# Patient Record
Sex: Female | Born: 1980 | Race: Black or African American | Hispanic: No | Marital: Married | Smoking: Former smoker
Health system: Southern US, Community
[De-identification: ages and names within clinical notes are randomized; demographics above are authoritative.]

## PROBLEM LIST (undated history)

## (undated) ENCOUNTER — Ambulatory Visit: Admission: RE | Payer: PRIVATE HEALTH INSURANCE | Source: Ambulatory Visit | Admitting: Plastic Surgery

## (undated) ENCOUNTER — Inpatient Hospital Stay (HOSPITAL_COMMUNITY): Payer: Self-pay

## (undated) DIAGNOSIS — Z8249 Family history of ischemic heart disease and other diseases of the circulatory system: Secondary | ICD-10-CM

## (undated) DIAGNOSIS — R0683 Snoring: Secondary | ICD-10-CM

## (undated) DIAGNOSIS — F419 Anxiety disorder, unspecified: Secondary | ICD-10-CM

## (undated) DIAGNOSIS — F32A Depression, unspecified: Secondary | ICD-10-CM

## (undated) DIAGNOSIS — Z289 Immunization not carried out for unspecified reason: Secondary | ICD-10-CM

## (undated) DIAGNOSIS — D259 Leiomyoma of uterus, unspecified: Secondary | ICD-10-CM

## (undated) DIAGNOSIS — Z5189 Encounter for other specified aftercare: Secondary | ICD-10-CM

## (undated) DIAGNOSIS — R102 Pelvic and perineal pain: Secondary | ICD-10-CM

## (undated) DIAGNOSIS — O4400 Placenta previa specified as without hemorrhage, unspecified trimester: Secondary | ICD-10-CM

## (undated) DIAGNOSIS — A499 Bacterial infection, unspecified: Secondary | ICD-10-CM

## (undated) DIAGNOSIS — N760 Acute vaginitis: Secondary | ICD-10-CM

## (undated) DIAGNOSIS — T4145XA Adverse effect of unspecified anesthetic, initial encounter: Secondary | ICD-10-CM

## (undated) DIAGNOSIS — O09219 Supervision of pregnancy with history of pre-term labor, unspecified trimester: Secondary | ICD-10-CM

## (undated) DIAGNOSIS — B373 Candidiasis of vulva and vagina: Secondary | ICD-10-CM

## (undated) DIAGNOSIS — Z8632 Personal history of gestational diabetes: Secondary | ICD-10-CM

## (undated) DIAGNOSIS — D649 Anemia, unspecified: Secondary | ICD-10-CM

## (undated) DIAGNOSIS — B9689 Other specified bacterial agents as the cause of diseases classified elsewhere: Secondary | ICD-10-CM

## (undated) DIAGNOSIS — Z8619 Personal history of other infectious and parasitic diseases: Secondary | ICD-10-CM

## (undated) DIAGNOSIS — D539 Nutritional anemia, unspecified: Secondary | ICD-10-CM

## (undated) DIAGNOSIS — O139 Gestational [pregnancy-induced] hypertension without significant proteinuria, unspecified trimester: Secondary | ICD-10-CM

## (undated) DIAGNOSIS — A749 Chlamydial infection, unspecified: Secondary | ICD-10-CM

## (undated) DIAGNOSIS — Z3490 Encounter for supervision of normal pregnancy, unspecified, unspecified trimester: Secondary | ICD-10-CM

## (undated) DIAGNOSIS — F329 Major depressive disorder, single episode, unspecified: Secondary | ICD-10-CM

## (undated) DIAGNOSIS — O09299 Supervision of pregnancy with other poor reproductive or obstetric history, unspecified trimester: Secondary | ICD-10-CM

## (undated) DIAGNOSIS — O24419 Gestational diabetes mellitus in pregnancy, unspecified control: Secondary | ICD-10-CM

## (undated) DIAGNOSIS — Z8659 Personal history of other mental and behavioral disorders: Secondary | ICD-10-CM

## (undated) DIAGNOSIS — B379 Candidiasis, unspecified: Secondary | ICD-10-CM

## (undated) DIAGNOSIS — O9989 Other specified diseases and conditions complicating pregnancy, childbirth and the puerperium: Secondary | ICD-10-CM

## (undated) HISTORY — DX: Depression, unspecified: F32.A

## (undated) HISTORY — DX: Family history of ischemic heart disease and other diseases of the circulatory system: Z82.49

## (undated) HISTORY — DX: Immunization not carried out for unspecified reason: Z28.9

## (undated) HISTORY — DX: Anxiety disorder, unspecified: F41.9

## (undated) HISTORY — DX: Snoring: R06.83

## (undated) HISTORY — DX: Other specified bacterial agents as the cause of diseases classified elsewhere: B96.89

## (undated) HISTORY — DX: Other specified diseases and conditions complicating pregnancy, childbirth and the puerperium: O99.89

## (undated) HISTORY — DX: Encounter for other specified aftercare: Z51.89

## (undated) HISTORY — DX: Nutritional anemia, unspecified: D53.9

## (undated) HISTORY — DX: Personal history of gestational diabetes: Z86.32

## (undated) HISTORY — DX: Adverse effect of unspecified anesthetic, initial encounter: T41.45XA

## (undated) HISTORY — DX: Chlamydial infection, unspecified: A74.9

## (undated) HISTORY — DX: Acute vaginitis: N76.0

## (undated) HISTORY — DX: Leiomyoma of uterus, unspecified: D25.9

## (undated) HISTORY — DX: Personal history of other infectious and parasitic diseases: Z86.19

## (undated) HISTORY — DX: Complete placenta previa nos or without hemorrhage, unspecified trimester: O44.00

## (undated) HISTORY — DX: Supervision of pregnancy with other poor reproductive or obstetric history, unspecified trimester: O09.299

## (undated) HISTORY — DX: Gestational diabetes mellitus in pregnancy, unspecified control: O24.419

## (undated) HISTORY — DX: Personal history of other mental and behavioral disorders: Z86.59

## (undated) HISTORY — DX: Supervision of pregnancy with history of pre-term labor, unspecified trimester: O09.219

## (undated) HISTORY — DX: Major depressive disorder, single episode, unspecified: F32.9

## (undated) HISTORY — DX: Bacterial infection, unspecified: A49.9

## (undated) HISTORY — DX: Candidiasis, unspecified: B37.9

## (undated) HISTORY — DX: Pelvic and perineal pain: R10.2

## (undated) HISTORY — PX: WISDOM TOOTH EXTRACTION: SHX21

## (undated) HISTORY — DX: Candidiasis of vulva and vagina: B37.3

## (undated) HISTORY — DX: Gestational (pregnancy-induced) hypertension without significant proteinuria, unspecified trimester: O13.9

---

## 2002-11-01 ENCOUNTER — Inpatient Hospital Stay (HOSPITAL_COMMUNITY): Admission: AD | Admit: 2002-11-01 | Discharge: 2002-11-01 | Payer: Self-pay | Admitting: *Deleted

## 2002-11-01 ENCOUNTER — Encounter: Payer: Self-pay | Admitting: *Deleted

## 2002-11-11 ENCOUNTER — Other Ambulatory Visit: Admission: RE | Admit: 2002-11-11 | Discharge: 2002-11-11 | Payer: Self-pay | Admitting: Obstetrics and Gynecology

## 2002-11-28 ENCOUNTER — Inpatient Hospital Stay (HOSPITAL_COMMUNITY): Admission: AD | Admit: 2002-11-28 | Discharge: 2002-11-28 | Payer: Self-pay | Admitting: Obstetrics and Gynecology

## 2003-02-05 ENCOUNTER — Inpatient Hospital Stay (HOSPITAL_COMMUNITY): Admission: AD | Admit: 2003-02-05 | Discharge: 2003-02-05 | Payer: Self-pay | Admitting: Obstetrics and Gynecology

## 2003-02-05 ENCOUNTER — Encounter: Payer: Self-pay | Admitting: Obstetrics and Gynecology

## 2003-02-24 ENCOUNTER — Encounter: Payer: Self-pay | Admitting: Obstetrics and Gynecology

## 2003-02-24 ENCOUNTER — Ambulatory Visit (HOSPITAL_COMMUNITY): Admission: RE | Admit: 2003-02-24 | Discharge: 2003-02-24 | Payer: Self-pay | Admitting: Obstetrics and Gynecology

## 2003-03-18 ENCOUNTER — Inpatient Hospital Stay (HOSPITAL_COMMUNITY): Admission: AD | Admit: 2003-03-18 | Discharge: 2003-03-18 | Payer: Self-pay | Admitting: Obstetrics and Gynecology

## 2003-05-21 ENCOUNTER — Inpatient Hospital Stay (HOSPITAL_COMMUNITY): Admission: AD | Admit: 2003-05-21 | Discharge: 2003-05-22 | Payer: Self-pay | Admitting: Obstetrics and Gynecology

## 2003-09-06 DIAGNOSIS — N760 Acute vaginitis: Secondary | ICD-10-CM

## 2003-09-06 DIAGNOSIS — R102 Pelvic and perineal pain unspecified side: Secondary | ICD-10-CM

## 2003-09-06 DIAGNOSIS — B9689 Other specified bacterial agents as the cause of diseases classified elsewhere: Secondary | ICD-10-CM

## 2003-09-06 HISTORY — DX: Other specified bacterial agents as the cause of diseases classified elsewhere: B96.89

## 2003-09-06 HISTORY — DX: Other specified bacterial agents as the cause of diseases classified elsewhere: N76.0

## 2003-09-06 HISTORY — PX: DILATION AND CURETTAGE OF UTERUS: SHX78

## 2003-09-06 HISTORY — DX: Pelvic and perineal pain unspecified side: R10.20

## 2003-09-06 HISTORY — DX: Pelvic and perineal pain: R10.2

## 2004-03-28 ENCOUNTER — Emergency Department (HOSPITAL_COMMUNITY): Admission: EM | Admit: 2004-03-28 | Discharge: 2004-03-28 | Payer: Self-pay | Admitting: Emergency Medicine

## 2004-07-23 ENCOUNTER — Inpatient Hospital Stay (HOSPITAL_COMMUNITY): Admission: AD | Admit: 2004-07-23 | Discharge: 2004-07-23 | Payer: Self-pay | Admitting: Obstetrics and Gynecology

## 2004-09-29 ENCOUNTER — Emergency Department (HOSPITAL_COMMUNITY): Admission: EM | Admit: 2004-09-29 | Discharge: 2004-09-29 | Payer: Self-pay | Admitting: Emergency Medicine

## 2005-01-31 ENCOUNTER — Emergency Department (HOSPITAL_COMMUNITY): Admission: EM | Admit: 2005-01-31 | Discharge: 2005-02-01 | Payer: Self-pay | Admitting: Emergency Medicine

## 2005-04-14 ENCOUNTER — Ambulatory Visit: Payer: Self-pay | Admitting: *Deleted

## 2005-04-14 ENCOUNTER — Inpatient Hospital Stay (HOSPITAL_COMMUNITY): Admission: AD | Admit: 2005-04-14 | Discharge: 2005-04-14 | Payer: Self-pay | Admitting: *Deleted

## 2005-04-14 ENCOUNTER — Ambulatory Visit (HOSPITAL_COMMUNITY): Admission: RE | Admit: 2005-04-14 | Discharge: 2005-04-14 | Payer: Self-pay | Admitting: Family Medicine

## 2005-04-16 ENCOUNTER — Inpatient Hospital Stay (HOSPITAL_COMMUNITY): Admission: AD | Admit: 2005-04-16 | Discharge: 2005-04-16 | Payer: Self-pay | Admitting: Obstetrics and Gynecology

## 2005-05-06 ENCOUNTER — Ambulatory Visit (HOSPITAL_COMMUNITY): Admission: RE | Admit: 2005-05-06 | Discharge: 2005-05-06 | Payer: Self-pay | Admitting: Obstetrics & Gynecology

## 2005-06-27 ENCOUNTER — Ambulatory Visit (HOSPITAL_COMMUNITY): Admission: RE | Admit: 2005-06-27 | Discharge: 2005-06-27 | Payer: Self-pay | Admitting: Obstetrics

## 2005-07-24 ENCOUNTER — Inpatient Hospital Stay (HOSPITAL_COMMUNITY): Admission: AD | Admit: 2005-07-24 | Discharge: 2005-07-24 | Payer: Self-pay | Admitting: Obstetrics & Gynecology

## 2005-09-05 DIAGNOSIS — Z5189 Encounter for other specified aftercare: Secondary | ICD-10-CM

## 2005-09-05 DIAGNOSIS — O24419 Gestational diabetes mellitus in pregnancy, unspecified control: Secondary | ICD-10-CM

## 2005-09-05 DIAGNOSIS — O139 Gestational [pregnancy-induced] hypertension without significant proteinuria, unspecified trimester: Secondary | ICD-10-CM

## 2005-09-05 DIAGNOSIS — IMO0001 Reserved for inherently not codable concepts without codable children: Secondary | ICD-10-CM

## 2005-09-05 DIAGNOSIS — T8859XA Other complications of anesthesia, initial encounter: Secondary | ICD-10-CM

## 2005-09-05 HISTORY — DX: Reserved for inherently not codable concepts without codable children: IMO0001

## 2005-09-05 HISTORY — DX: Other complications of anesthesia, initial encounter: T88.59XA

## 2005-09-05 HISTORY — DX: Gestational diabetes mellitus in pregnancy, unspecified control: O24.419

## 2005-09-05 HISTORY — DX: Gestational (pregnancy-induced) hypertension without significant proteinuria, unspecified trimester: O13.9

## 2005-09-05 HISTORY — DX: Encounter for other specified aftercare: Z51.89

## 2006-01-01 ENCOUNTER — Inpatient Hospital Stay (HOSPITAL_COMMUNITY): Admission: AD | Admit: 2006-01-01 | Discharge: 2006-01-01 | Payer: Self-pay | Admitting: Obstetrics & Gynecology

## 2006-11-18 ENCOUNTER — Emergency Department (HOSPITAL_COMMUNITY): Admission: EM | Admit: 2006-11-18 | Discharge: 2006-11-18 | Payer: Self-pay | Admitting: *Deleted

## 2008-09-05 HISTORY — PX: LAPAROSCOPIC GASTRIC BANDING: SHX1100

## 2009-09-05 DIAGNOSIS — B373 Candidiasis of vulva and vagina: Secondary | ICD-10-CM

## 2009-09-05 DIAGNOSIS — B3731 Acute candidiasis of vulva and vagina: Secondary | ICD-10-CM

## 2009-09-05 HISTORY — DX: Acute candidiasis of vulva and vagina: B37.31

## 2009-09-05 HISTORY — DX: Candidiasis of vulva and vagina: B37.3

## 2009-10-20 ENCOUNTER — Emergency Department (HOSPITAL_COMMUNITY): Admission: EM | Admit: 2009-10-20 | Discharge: 2009-10-20 | Payer: Self-pay | Admitting: Emergency Medicine

## 2010-02-02 ENCOUNTER — Emergency Department (HOSPITAL_COMMUNITY): Admission: EM | Admit: 2010-02-02 | Discharge: 2010-02-02 | Payer: Self-pay | Admitting: Emergency Medicine

## 2010-02-18 ENCOUNTER — Inpatient Hospital Stay (HOSPITAL_COMMUNITY): Admission: AD | Admit: 2010-02-18 | Discharge: 2010-02-19 | Payer: Self-pay | Admitting: Obstetrics & Gynecology

## 2010-03-13 ENCOUNTER — Emergency Department (HOSPITAL_COMMUNITY): Admission: EM | Admit: 2010-03-13 | Discharge: 2010-03-13 | Payer: Self-pay | Admitting: Emergency Medicine

## 2010-07-26 ENCOUNTER — Ambulatory Visit: Payer: Self-pay | Admitting: Internal Medicine

## 2010-08-11 ENCOUNTER — Encounter: Admission: RE | Admit: 2010-08-11 | Payer: Self-pay | Source: Home / Self Care | Admitting: Obstetrics and Gynecology

## 2010-08-18 ENCOUNTER — Encounter: Admission: RE | Admit: 2010-08-18 | Payer: Self-pay | Source: Home / Self Care | Admitting: Obstetrics and Gynecology

## 2010-09-05 DIAGNOSIS — F32A Depression, unspecified: Secondary | ICD-10-CM

## 2010-09-05 HISTORY — DX: Depression, unspecified: F32.A

## 2010-09-11 ENCOUNTER — Inpatient Hospital Stay (HOSPITAL_COMMUNITY)
Admission: AD | Admit: 2010-09-11 | Discharge: 2010-09-11 | Payer: Self-pay | Source: Home / Self Care | Attending: Obstetrics and Gynecology | Admitting: Obstetrics and Gynecology

## 2010-09-20 LAB — URINALYSIS, ROUTINE W REFLEX MICROSCOPIC
Bilirubin Urine: NEGATIVE
Hgb urine dipstick: NEGATIVE
Ketones, ur: NEGATIVE mg/dL
Nitrite: NEGATIVE
Protein, ur: NEGATIVE mg/dL
Specific Gravity, Urine: 1.01 (ref 1.005–1.030)
Urine Glucose, Fasting: NEGATIVE mg/dL
Urobilinogen, UA: 0.2 mg/dL (ref 0.0–1.0)
pH: 6.5 (ref 5.0–8.0)

## 2010-09-20 LAB — COMPREHENSIVE METABOLIC PANEL
ALT: 9 U/L (ref 0–35)
AST: 13 U/L (ref 0–37)
Albumin: 2.4 g/dL — ABNORMAL LOW (ref 3.5–5.2)
Alkaline Phosphatase: 123 U/L — ABNORMAL HIGH (ref 39–117)
BUN: 3 mg/dL — ABNORMAL LOW (ref 6–23)
CO2: 24 mEq/L (ref 19–32)
Calcium: 8.8 mg/dL (ref 8.4–10.5)
Chloride: 106 mEq/L (ref 96–112)
Creatinine, Ser: 0.53 mg/dL (ref 0.4–1.2)
GFR calc Af Amer: >60 mL/min (ref >60)
GFR calc non Af Amer: >60 mL/min (ref >60)
Glucose, Bld: 126 mg/dL — ABNORMAL HIGH (ref 70–99)
Potassium: 3.3 mEq/L — ABNORMAL LOW (ref 3.5–5.1)
Sodium: 135 mEq/L (ref 135–145)
Total Bilirubin: 0.2 mg/dL — ABNORMAL LOW (ref 0.3–1.2)
Total Protein: 6 g/dL (ref 6.0–8.3)

## 2010-09-20 LAB — URINE CULTURE
Colony Count: 50000
Culture  Setup Time: 201201072033
Special Requests: NEGATIVE

## 2010-09-20 LAB — CBC
HCT: 25 % — ABNORMAL LOW (ref 36.0–46.0)
Hemoglobin: 7.1 g/dL — ABNORMAL LOW (ref 12.0–15.0)
MCH: 16.3 pg — ABNORMAL LOW (ref 26.0–34.0)
MCHC: 28.4 g/dL — ABNORMAL LOW (ref 30.0–36.0)
MCV: 57.3 fL — ABNORMAL LOW (ref 78.0–100.0)
Platelets: 304 10*3/uL (ref 150–400)
RBC: 4.36 MIL/uL (ref 3.87–5.11)
RDW: 20.7 % — ABNORMAL HIGH (ref 11.5–15.5)
WBC: 10.1 10*3/uL (ref 4.0–10.5)

## 2010-09-20 LAB — URINE MICROSCOPIC-ADD ON

## 2010-09-20 LAB — URIC ACID: Uric Acid, Serum: 3.3 mg/dL (ref 2.4–7.0)

## 2010-09-20 LAB — LACTATE DEHYDROGENASE: LDH: 101 U/L (ref 94–250)

## 2010-09-22 LAB — BASIC METABOLIC PANEL
BUN: 4 mg/dL — ABNORMAL LOW (ref 6–23)
CO2: 24 mEq/L (ref 19–32)
Calcium: 8.6 mg/dL (ref 8.4–10.5)
Chloride: 106 mEq/L (ref 96–112)
Creatinine, Ser: 0.55 mg/dL (ref 0.4–1.2)
GFR calc Af Amer: >60 mL/min (ref >60)
GFR calc non Af Amer: >60 mL/min (ref >60)
Glucose, Bld: 112 mg/dL — ABNORMAL HIGH (ref 70–99)
Potassium: 3.8 mEq/L (ref 3.5–5.1)
Sodium: 136 mEq/L (ref 135–145)

## 2010-09-22 LAB — CBC
HCT: 26.3 % — ABNORMAL LOW (ref 36.0–46.0)
Hemoglobin: 7.2 g/dL — ABNORMAL LOW (ref 12.0–15.0)
MCH: 15.8 pg — ABNORMAL LOW (ref 26.0–34.0)
MCHC: 27.4 g/dL — ABNORMAL LOW (ref 30.0–36.0)
MCV: 57.8 fL — ABNORMAL LOW (ref 78.0–100.0)
Platelets: 374 10*3/uL (ref 150–400)
RBC: 4.55 MIL/uL (ref 3.87–5.11)
RDW: 20.8 % — ABNORMAL HIGH (ref 11.5–15.5)
WBC: 8.8 10*3/uL (ref 4.0–10.5)

## 2010-09-22 LAB — SURGICAL PCR SCREEN
MRSA, PCR: NEGATIVE
Staphylococcus aureus: NEGATIVE

## 2010-09-22 LAB — RPR: RPR Ser Ql: NONREACTIVE

## 2010-09-25 ENCOUNTER — Encounter (INDEPENDENT_AMBULATORY_CARE_PROVIDER_SITE_OTHER): Payer: Self-pay | Admitting: Obstetrics and Gynecology

## 2010-09-25 ENCOUNTER — Inpatient Hospital Stay (HOSPITAL_COMMUNITY)
Admission: AD | Admit: 2010-09-25 | Discharge: 2010-09-27 | Payer: Self-pay | Source: Home / Self Care | Attending: Obstetrics and Gynecology | Admitting: Obstetrics and Gynecology

## 2010-09-26 ENCOUNTER — Encounter: Payer: Self-pay | Admitting: Obstetrics & Gynecology

## 2010-09-27 ENCOUNTER — Inpatient Hospital Stay: Admission: RE | Admit: 2010-09-27 | Payer: Self-pay | Source: Home / Self Care | Admitting: Obstetrics and Gynecology

## 2010-09-28 LAB — RPR: RPR Ser Ql: NONREACTIVE

## 2010-09-28 LAB — CBC
HCT: 27 % — ABNORMAL LOW (ref 36.0–46.0)
HCT: 21.3 % — ABNORMAL LOW (ref 36.0–46.0)
Hemoglobin: 7.5 g/dL — ABNORMAL LOW (ref 12.0–15.0)
Hemoglobin: 5.7 g/dL — CL (ref 12.0–15.0)
MCH: 15.9 pg — ABNORMAL LOW (ref 26.0–34.0)
MCHC: 27.8 g/dL — ABNORMAL LOW (ref 30.0–36.0)
MCHC: 27.2 g/dL — ABNORMAL LOW (ref 30.0–36.0)
MCV: 57.3 fL — ABNORMAL LOW (ref 78.0–100.0)
Platelets: 362 10*3/uL (ref 150–400)
Platelets: 299 10*3/uL (ref 150–400)
RBC: 4.71 MIL/uL (ref 3.87–5.11)
RBC: 4.41 MIL/uL (ref 3.87–5.11)
RDW: 20.7 % — ABNORMAL HIGH (ref 11.5–15.5)
RDW: 20.8 % — ABNORMAL HIGH (ref 11.5–15.5)
RDW: 23.2 % — ABNORMAL HIGH (ref 11.5–15.5)
WBC: 11.9 10*3/uL — ABNORMAL HIGH (ref 4.0–10.5)
WBC: 10.1 10*3/uL (ref 4.0–10.5)
WBC: 11.3 10*3/uL — ABNORMAL HIGH (ref 4.0–10.5)

## 2010-09-28 LAB — GLUCOSE, CAPILLARY
Glucose-Capillary: 132 mg/dL — ABNORMAL HIGH (ref 70–99)
Glucose-Capillary: 142 mg/dL — ABNORMAL HIGH (ref 70–99)
Glucose-Capillary: 105 mg/dL — ABNORMAL HIGH (ref 70–99)
Glucose-Capillary: 136 mg/dL — ABNORMAL HIGH (ref 70–99)
Glucose-Capillary: 66 mg/dL — ABNORMAL LOW (ref 70–99)

## 2010-09-28 LAB — TYPE AND SCREEN
ABO/RH(D): A POS
Antibody Screen: NEGATIVE
Unit division: 0
Unit division: 0

## 2010-09-28 LAB — RETICULOCYTES
RBC.: 3.6 MIL/uL — ABNORMAL LOW (ref 3.87–5.11)
Retic Count, Absolute: 72 10*3/uL (ref 19.0–186.0)
Retic Ct Pct: 2 % (ref 0.4–3.1)

## 2010-09-28 LAB — VITAMIN B12: Vitamin B-12: 262 pg/mL (ref 211–911)

## 2010-09-28 LAB — FOLATE: Folate: 6.5 ng/mL

## 2010-09-28 LAB — IRON AND TIBC: TIBC: 337 ug/dL (ref 250–470)

## 2010-09-30 NOTE — Op Note (Addendum)
NAME:  [name], [name]               ACCOUNT NO.:  [id number]  MEDICAL RECORD NO.:  [id number]          PATIENT TYPE:  INP  LOCATION:  9136                          FACILITY:  [facility]  PHYSICIAN:  Hal Morales, M.D.DATE OF BIRTH:  [birth year]  DATE OF PROCEDURE:  09/25/2010 DATE OF DISCHARGE:                              OPERATIVE REPORT   PREOPERATIVE DIAGNOSES: 1. Intrauterine pregnancy at 38 weeks and 5 days. 2. Prior cesarean section with desire for repeat cesarean section. 3. Gestational diabetes. 4. Question type 2 diabetes versus gestational diabetes 5. Severe anemia. 6. Morbid obesity. 7. History of macrosomia.  POSTOPERATIVE DIAGNOSES: 1. Intrauterine pregnancy at 38 weeks and 5 days. 2. Prior cesarean section with desire for repeat cesarean section. 3. Gestational diabetes. 4. Question type 2 diabetes versus gestational diabetes 5. Severe anemia. 6. Morbid obesity. 7. History of macrosomia. 8. Meconium-stained amniotic fluid.  PROCEDURE:  Repeat low transverse cesarean section.  SURGEON:  Hal Morales, MD  FIRST ASSISTANT:  Larna Daughters, CNM  ANESTHESIA:  Spinal.  ESTIMATED BLOOD LOSS:  750 mL.  COMPLICATIONS:  None.  FINDINGS:  The patient was delivered of a female infant, weighing 8 pounds 2 ounces with Apgars of 9 and 9 at 1 and 5 minutes respectively.  The placenta contained an eccentrically inserted 3-vessel cord.  The membranes were meconium-stained.  The uterus, tubes, and ovaries were normal for the gravid state.  After removal of the placenta, there were some remaining meconium-stained membranes which required curettage.  DESCRIPTION OF PROCEDURE:  The patient was taken to the operating room after appropriate identification and after the establishment that early labor had ensued.  Membranes were intact.  She was taken to the operating room and a spinal anesthetic was placed.  The abdomen was prepped with ChloraPrep and the perineum  was prepped with Betadine.  The Foley catheter was inserted into the bladder under sterile conditions and connected to straight drainage.  The abdomen was draped as a sterile field after a 3-minute drying period for the ChloraPrep.  The site of the previous cesarean section was tested and found to have adequate surgical anesthesia.  This area was infiltrated with 10 mL of 0.25% Marcaine.  An incision was then placed at the site of the previous cesarean section incision and the abdomen was opened in layers.  The peritoneum was entered and the bladder blade placed.  The uterus was incised approximately 2 cm above the uterovesical fold and that incision taken laterally on either side bluntly.  The infant was delivered from the occiput transverse position and after having cord clamped and cut, was handed off to the awaiting pediatricians.  The placenta was allowed to separate from the uterus with uterine massage, however, there was a fair amount of membranes that were left in the uterus and this was removed with sharp curettage and laparotomy pads.  Uterine incision was then closed with a running interlocking suture of 0 Vicryl.  An imbricating suture of 0 Vicryl was placed.  Adequate hemostasis was achieved after placement of a serosal suture of 0 Vicryl.  Copious irrigation was carried out.  The abdominal peritoneum was closed with running suture of 2-0 Vicryl.  The rectus muscles were reapproximated in the midline with figure-of-eight suture of 2-0 Vicryl.  The rectus fascia was closed with running suture of 0 Vicryl, then reinforced on either side of midline with figure-of-eight sutures of 0 Vicryl.  The subcutaneous tissue was copiously irrigated and made hemostatic with Bovie cautery.  A 7-mm Jackson-Pratt drain was placed in the subcutaneous tissue and exited through a stab wound in the left lower quadrant.  It was sewn in place with a suture of 0 silk.  The skin incision was closed  with a subcuticular suture of 3-0 Monocryl.  The grenade was attached to the Jackson-Pratt drain and a sterile dressing applied.  The patient was taken from the operating room to the recovery room in satisfactory condition, having tolerated the procedure well with sponge and instrument counts correct.  Specimens to pathology, placenta and endometrial curettings.  The infant went to the full-term nursery.     Hal Morales, M.D.     VPH/MEDQ  D:  09/25/2010  T:  09/25/2010  Job:  [id number]  Electronically Signed by Dierdre Forth M.D. on 09/30/2010 03:33:02 PM

## 2010-11-21 LAB — URINALYSIS, ROUTINE W REFLEX MICROSCOPIC
Bilirubin Urine: NEGATIVE
Hgb urine dipstick: NEGATIVE
Ketones, ur: NEGATIVE mg/dL
Nitrite: NEGATIVE
Protein, ur: NEGATIVE mg/dL
Specific Gravity, Urine: 1.02 (ref 1.005–1.030)
Urobilinogen, UA: 0.2 mg/dL (ref 0.0–1.0)

## 2010-11-21 LAB — CBC
MCHC: 30.7 g/dL (ref 30.0–36.0)
MCV: 59.1 fL — ABNORMAL LOW (ref 78.0–100.0)
RBC: 4.84 MIL/uL (ref 3.87–5.11)
RDW: 19.3 % — ABNORMAL HIGH (ref 11.5–15.5)

## 2010-11-21 LAB — HCG, QUANTITATIVE, PREGNANCY: hCG, Beta Chain, Quant, S: 74498 m[IU]/mL — ABNORMAL HIGH (ref <5)

## 2010-11-22 LAB — URINALYSIS, ROUTINE W REFLEX MICROSCOPIC
Bilirubin Urine: NEGATIVE
Hgb urine dipstick: NEGATIVE
Ketones, ur: NEGATIVE mg/dL
Specific Gravity, Urine: 1.012 (ref 1.005–1.030)
Urobilinogen, UA: 0.2 mg/dL (ref 0.0–1.0)

## 2010-11-22 LAB — URINE MICROSCOPIC-ADD ON

## 2010-11-22 LAB — POCT PREGNANCY, URINE: Preg Test, Ur: POSITIVE

## 2012-02-05 ENCOUNTER — Inpatient Hospital Stay (HOSPITAL_COMMUNITY)
Admission: AD | Admit: 2012-02-05 | Discharge: 2012-02-05 | Disposition: A | Discharge status: Home / Self Care | Payer: Medicaid Other | Source: Ambulatory Visit | Attending: Family Medicine | Admitting: Family Medicine

## 2012-02-05 ENCOUNTER — Encounter (HOSPITAL_COMMUNITY): Payer: Self-pay

## 2012-02-05 ENCOUNTER — Inpatient Hospital Stay (HOSPITAL_COMMUNITY): Payer: Medicaid Other

## 2012-02-05 DIAGNOSIS — E669 Obesity, unspecified: Secondary | ICD-10-CM | POA: Insufficient documentation

## 2012-02-05 DIAGNOSIS — O209 Hemorrhage in early pregnancy, unspecified: Secondary | ICD-10-CM | POA: Insufficient documentation

## 2012-02-05 DIAGNOSIS — B373 Candidiasis of vulva and vagina: Secondary | ICD-10-CM | POA: Insufficient documentation

## 2012-02-05 DIAGNOSIS — D509 Iron deficiency anemia, unspecified: Secondary | ICD-10-CM | POA: Insufficient documentation

## 2012-02-05 DIAGNOSIS — O2390 Unspecified genitourinary tract infection in pregnancy, unspecified trimester: Secondary | ICD-10-CM | POA: Insufficient documentation

## 2012-02-05 DIAGNOSIS — O99019 Anemia complicating pregnancy, unspecified trimester: Secondary | ICD-10-CM | POA: Insufficient documentation

## 2012-02-05 DIAGNOSIS — B3731 Acute candidiasis of vulva and vagina: Secondary | ICD-10-CM | POA: Insufficient documentation

## 2012-02-05 HISTORY — DX: Anemia, unspecified: D64.9

## 2012-02-05 LAB — CBC
Hemoglobin: 6.7 g/dL — CL (ref 12.0–15.0)
MCH: 14.8 pg — ABNORMAL LOW (ref 26.0–34.0)
MCV: 54.6 fL — ABNORMAL LOW (ref 78.0–100.0)
RBC: 4.54 MIL/uL (ref 3.87–5.11)

## 2012-02-05 LAB — URINALYSIS, ROUTINE W REFLEX MICROSCOPIC
Ketones, ur: NEGATIVE mg/dL
Nitrite: NEGATIVE
Protein, ur: NEGATIVE mg/dL
pH: 6 (ref 5.0–8.0)

## 2012-02-05 LAB — WET PREP, GENITAL
Clue Cells Wet Prep HPF POC: NONE SEEN
Trich, Wet Prep: NONE SEEN

## 2012-02-05 MED ORDER — CONCEPT OB 130-92.4-1 MG PO CAPS: 1.0000 | ORAL_CAPSULE | ORAL | Status: DC

## 2012-02-05 MED ORDER — FLUCONAZOLE 150 MG PO TABS: 150.0000 mg | ORAL_TABLET | Freq: Once | ORAL | Status: AC

## 2012-02-05 NOTE — MAU Note (Signed)
CRITICAL VALUE ALERT  Critical value received:  Hemoglobin 6.7  Date of notification:  02/05/12  Time of notification:  0535  Critical value read back:yes  Nurse who received alert:  Quintella Baton [facility]  MD notified (1st page):  Deirdre Poe CNM  Time of first page:  [phone]  MD notified (2nd page):  Time of second page:  Responding MD:    Time MD responded:

## 2012-02-05 NOTE — Discharge Instructions (Signed)
Candidal Vulvovaginitis Candidal vulvovaginitis is an infection of the vagina and vulva. The vulva is the skin around the opening of the vagina. This may cause itching and discomfort in and around the vagina.  HOME CARE  Only take medicine as told by your doctor.   Do not have sex (intercourse) until the infection is healed or as told by your doctor.   Practice safe sex.   Tell your sex partner about your infection.   Do not douche or use tampons.   Wear cotton underwear. Do not wear tight pants or panty hose.   Eat yogurt. This may help treat and prevent yeast infections.  GET HELP RIGHT AWAY IF:   You have a fever.   Your problems get worse during treatment or do not get better in 3 days.   You have discomfort, irritation, or itching in your vagina or vulva area.   You have pain after sex.   You start to get belly (abdominal) pain.  MAKE SURE YOU:  Understand these instructions.   Will watch your condition.   Will get help right away if you are not doing well or get worse.  Document Released: 11/18/2008 Document Revised: 08/11/2011 Document Reviewed: 11/18/2008 [facility] Patient Information 2012 [place], [place].  Prenatal [facility]    [facility]  & [facility]ertility  [phone]     Phone: [phone]          [facility]                      [facility]  @[place]     Phone: [phone]  Phone: [phone]         [facility]     Phone: [phone]  Phone: [phone]           [facility] @ [place]                hone: [phone]  Phone: [phone]         [facility] Dr. Francoise Ceo      Phone: [phone]  Phone: [phone]         [facility]                Phone: [phone]  [facility]   Phone:[phone]    [facility] [address] [place])          Phone:  [phone] [facility]   Phone: [phone]

## 2012-02-05 NOTE — MAU Provider Note (Signed)
[name] [id number] @[redacted]w[redacted]d  by LMP Chief Complaint  Patient presents with  . Vaginal Bleeding  . Abdominal Cramping        SUBJECTIVE  HPI Scanty pink vaginal spotting x 1 day. No abd pain. Intercourse 2 days ago. Also having vulvovaginal itch and self treated with Monistat last night. A pos. [facility] pt, NPC yet.   Past Medical History  Diagnosis Date  . Anemia    No past surgical history on file. History   Social History  . Marital Status: Married    Spouse Name: N/A    Number of Children: N/A  . Years of Education: N/A   Occupational History  . Not on file.   Social History Main Topics  . Smoking status: Current Everyday Smoker    Types: Cigarettes  . Smokeless tobacco: Not on file   Comment: patient states that she quit nd week of may2013  . Alcohol Use: No  . Drug Use: No  . Sexually Active: Yes    Birth Control/ Protection: None   Other Topics Concern  . Not on file   Social History Narrative  . No narrative on file   No current facility-administered medications on file prior to encounter.   No current outpatient prescriptions on file prior to encounter.   Allergies not on file  ROS: Pertinent items in HPI  OBJECTIVE Blood pressure 133/71, pulse 83, temperature 98.2 F (36.8 C), temperature source Oral, resp. rate 18, last menstrual period 12/20/2011.  GENERAL: Well-developed, well-nourished female in no acute distress.  HEENT: Normocephalic, good dentition HEART: normal rate RESP: normal effort ABDOMEN: Soft, nontender EXTREMITIES: Nontender, no edema NEURO: Alert and oriented SPECULUM EXAM: pale vulvar erythematous rash extends to intertriginous area., thick white discharge, scant brown blood noted, cervix clean BIMANUAL: cx long, closed, unable to outline uterus,  no adnexal tenderness or masses   LAB RESULTS  Results for orders placed during the hospital encounter of 02/05/12 (from the past 24 hour(s))  URINALYSIS, ROUTINE W REFLEX  MICROSCOPIC     Status: Abnormal   Collection Time   02/05/12  4:20 AM      Component Value Range   Color, Urine YELLOW  YELLOW    APPearance HAZY (*) CLEAR    Specific Gravity, Urine <1.005 (*) 1.005 - 1.030    pH 6.0  5.0 - 8.0    Glucose, UA NEGATIVE  NEGATIVE (mg/dL)   Hgb urine dipstick TRACE (*) NEGATIVE    Bilirubin Urine NEGATIVE  NEGATIVE    Ketones, ur NEGATIVE  NEGATIVE (mg/dL)   Protein, ur NEGATIVE  NEGATIVE (mg/dL)   Urobilinogen, UA 0.2  0.0 - 1.0 (mg/dL)   Nitrite NEGATIVE  NEGATIVE    Leukocytes, UA MODERATE (*) NEGATIVE   URINE MICROSCOPIC-ADD ON     Status: Abnormal   Collection Time   02/05/12  4:20 AM      Component Value Range   Squamous Epithelial / LPF FEW (*) RARE    WBC, UA 3-6  <3 (WBC/hpf)   RBC / HPF 0-2  <3 (RBC/hpf)   Bacteria, UA FEW (*) RARE   POCT PREGNANCY, URINE     Status: Abnormal   Collection Time   02/05/12  4:35 AM      Component Value Range   Preg Test, Ur POSITIVE (*) NEGATIVE   CBC     Status: Abnormal   Collection Time   02/05/12  5:10 AM      Component Value Range  WBC 7.7  4.0 - 10.5 (K/uL)   RBC 4.54  3.87 - 5.11 (MIL/uL)   Hemoglobin 6.7 (*) 12.0 - 15.0 (g/dL)   HCT 16.1 (*) 09.6 - 46.0 (%)   MCV 54.6 (*) 78.0 - 100.0 (fL)   MCH 14.8 (*) 26.0 - 34.0 (pg)   MCHC 27.0 (*) 30.0 - 36.0 (g/dL)   RDW 04.5 (*) 40.9 - 15.5 (%)   Platelets 386  150 - 400 (K/uL)  HCG, QUANTITATIVE, PREGNANCY     Status: Abnormal   Collection Time   02/05/12  5:10 AM      Component Value Range   hCG, Beta Chain, Quant, S 8787 (*) <5 (mIU/mL)    IMAGING IUP [redacted]w[redacted]d FHR 122 Moderate SCH   ASSESSMENT [id number] at [redacted]w[redacted]d  EPB with [facility] Chronic Fe++ defic anemia Candidal vulvovaginitis Obesity  PLAN Preg verification letter, Rx PNVs and add iron supplement bid, pregnancy verification letter and precautions, list of [facility] providers Rx Diflucan GC/CT done    Lowen Mansouri 02/05/2012 6:10 AM

## 2012-02-05 NOTE — MAU Provider Note (Signed)
Chart reviewed and agree with management and plan.  

## 2012-02-05 NOTE — MAU Note (Signed)
Patient is in with c/o mild cramping and that she noticed spotting yesterday evening. She states that she confirmed pregnancy at the health dept, noticed vaginal discharge used one day yeast treatment otc.

## 2012-02-06 LAB — GC/CHLAMYDIA PROBE AMP, GENITAL: GC Probe Amp, Genital: NEGATIVE

## 2012-02-28 ENCOUNTER — Encounter (HOSPITAL_COMMUNITY): Payer: Self-pay

## 2012-02-28 ENCOUNTER — Emergency Department (HOSPITAL_COMMUNITY)
Admission: EM | Admit: 2012-02-28 | Discharge: 2012-02-28 | Disposition: A | Discharge status: Home / Self Care | Payer: Medicaid Other | Source: Home / Self Care | Attending: Emergency Medicine | Admitting: Emergency Medicine

## 2012-02-28 DIAGNOSIS — J039 Acute tonsillitis, unspecified: Secondary | ICD-10-CM

## 2012-02-28 HISTORY — DX: Encounter for supervision of normal pregnancy, unspecified, unspecified trimester: Z34.90

## 2012-02-28 MED ORDER — PENICILLIN V POTASSIUM 500 MG PO TABS: 500.0000 mg | ORAL_TABLET | Freq: Three times a day (TID) | ORAL | Status: DC

## 2012-02-28 NOTE — ED Provider Notes (Signed)
History     CSN: [id number]  Arrival date & time 02/28/12  1142   First MD Initiated Contact with Patient 02/28/12 1205      Chief Complaint  Patient presents with  . Sore Throat    (Consider location/radiation/quality/duration/timing/severity/associated sxs/prior treatment) Patient is a 31 y.o. female presenting with pharyngitis. The history is provided by the patient.  Sore Throat This is a new problem. The current episode started yesterday. The problem occurs constantly. The problem has been gradually worsening. Pertinent negatives include no abdominal pain, no headaches and no shortness of breath. The symptoms are aggravated by swallowing. Nothing relieves the symptoms. The treatment provided no relief.    Past Medical History  Diagnosis Date  . Anemia   . Pregnant     Past Surgical History  Procedure Date  . Cesarean section     No family history on file.  History  Substance Use Topics  . Smoking status: Former Smoker    Types: Cigarettes  . Smokeless tobacco: Not on file   Comment: patient states that she quit nd week of may2013  . Alcohol Use: No    OB History    Grav Para Term Preterm Abortions TAB SAB Ect Mult Living   5 3 3  1  1   3       Review of Systems  Constitutional: Positive for activity change, appetite change and fatigue. Negative for unexpected weight change.  HENT: Positive for sore throat. Negative for ear pain, facial swelling, rhinorrhea, drooling, mouth sores, trouble swallowing, neck pain, neck stiffness, postnasal drip and sinus pressure.   Eyes: Negative for pain.  Respiratory: Negative for shortness of breath.   Gastrointestinal: Negative for abdominal pain.  Genitourinary: Negative for dysuria.  Neurological: Positive for dizziness. Negative for syncope, numbness and headaches.    Allergies  Review of patient's allergies indicates no known allergies.  Home Medications   Current Outpatient Rx  Name Route Sig Dispense Refill    . FOLIC ACID PO Oral Take by mouth daily.    Marland Kitchen PENICILLIN V POTASSIUM 500 MG PO TABS Oral Take 1 tablet (500 mg total) by mouth 3 (three) times daily. 30 tablet 0  . CONCEPT OB 130-92.4-1 MG PO CAPS Oral Take 1 tablet by mouth 1 day or 1 dose. 30 capsule 5    BP 135/71  Pulse 90  Temp 100 F (37.8 C) (Oral)  Resp 20  SpO2 100%  LMP 12/20/2011  Physical Exam  Nursing note and vitals reviewed. Constitutional: She appears well-developed and well-nourished.  HENT:  Head: Normocephalic.  Mouth/Throat: Uvula is midline. Oropharyngeal exudate and posterior oropharyngeal erythema present. No tonsillar abscesses.    Eyes: Conjunctivae are normal.  Neck: Normal range of motion. Neck supple. No JVD present.  Cardiovascular: Normal rate.   Pulmonary/Chest: Effort normal.  Lymphadenopathy:    She has cervical adenopathy.  Skin: No rash noted. No erythema.    ED Course  Procedures (including critical care time)   Labs Reviewed  POCT RAPID STREP A ([facility] URG CARE ONLY)   No results found.   1. Tonsillitis       MDM  Vision with left-sided predominant tonsillitis and reactive anterior cervical lymphadenopathy some started on penicillin per to take, all in hydrate herself well. Recommended to return if no improvement after 72 hours and antibiotics if worsening symptoms patient is aware that she will need to go to the emergency department for further evaluation.  Jimmie Molly, MD 02/28/12 [phone]

## 2012-02-28 NOTE — ED Notes (Signed)
She is [redacted] weeks pregnant.  C/o sore throat, fatigue and dizziness since yesterday.  Denies known fever.

## 2012-02-28 NOTE — Discharge Instructions (Signed)
  If no improvement in the next 48-72 hours or problem swallowing should go to the emergency department.  Tonsillitis Tonsils are lumps of tissue at the back of the throat. Tonsillitis is an infection of the throat. This infection causes the tonsils to become red, tender, and puffy (swollen). If germs (bacteria) caused the infection, an antibiotic medicine will be given to you. If your tonsillitis is severe and happens often, you may need to get your tonsils removed (tonsillectomy). HOME CARE   Rest and sleep often.   Drink enough fluids to keep your pee (urine) clear or pale yellow.   While your throat is sore, eat soft or liquid foods like:   Soup.   Ice cream.   Instant breakfast drinks.   Eat frozen ice pops.   Gargle with a warm or cold liquid to help soothe the throat. Gargle with a water and salt mix. Mix 1 teaspoon of salt in 1 cup of water.   Only take medicines as told by your doctor.   If you are given medicines (antibiotics), take them as told. Finish them even if you start to feel better.  GET HELP RIGHT AWAY IF:   You throw up (vomit).   You have a very bad headache.   You have a stiff neck.   You have chest pain.   You have trouble breathing or swallowing.   You have bad throat pain, drooling, or your voice changes.   You have bad pain not helped by medicine.   You cannot fully open your mouth.   You have redness, puffiness, or bad pain in the neck.   You have a fever.   The patient is a child younger than 3 months and has a fever.   The patient is a child older than 3 months and has a fever or problems that do not go away.   The patient is a child older than 3 months, has a fever, and his or her problems get worse.   You have large, tender lumps on your neck.   You have a rash.   You cough up green, yellow-brown, or bloody fluid.   You cannot swallow liquids or food for 24 hours.   The patient is a child and cannot swallow liquids or food  for 12 hours.  MAKE SURE YOU:   Understand these instructions.   Will watch your condition.   Will get help right away if you are not doing well or get worse.  Document Released: 02/08/2008 Document Revised: 08/11/2011 Document Reviewed: 10/28/2010 [facility] Patient Information 2012 [place], [place].

## 2012-03-04 ENCOUNTER — Inpatient Hospital Stay (HOSPITAL_COMMUNITY)
Admission: AD | Admit: 2012-03-04 | Discharge: 2012-03-04 | Disposition: A | Discharge status: Home / Self Care | Payer: Medicaid Other | Source: Ambulatory Visit | Attending: Obstetrics and Gynecology | Admitting: Obstetrics and Gynecology

## 2012-03-04 ENCOUNTER — Encounter (HOSPITAL_COMMUNITY): Payer: Self-pay | Admitting: *Deleted

## 2012-03-04 ENCOUNTER — Inpatient Hospital Stay (HOSPITAL_COMMUNITY): Payer: Medicaid Other

## 2012-03-04 DIAGNOSIS — B373 Candidiasis of vulva and vagina: Secondary | ICD-10-CM | POA: Insufficient documentation

## 2012-03-04 DIAGNOSIS — O43899 Other placental disorders, unspecified trimester: Secondary | ICD-10-CM

## 2012-03-04 DIAGNOSIS — O2340 Unspecified infection of urinary tract in pregnancy, unspecified trimester: Secondary | ICD-10-CM

## 2012-03-04 DIAGNOSIS — B3731 Acute candidiasis of vulva and vagina: Secondary | ICD-10-CM | POA: Insufficient documentation

## 2012-03-04 DIAGNOSIS — O2390 Unspecified genitourinary tract infection in pregnancy, unspecified trimester: Secondary | ICD-10-CM | POA: Insufficient documentation

## 2012-03-04 DIAGNOSIS — O209 Hemorrhage in early pregnancy, unspecified: Secondary | ICD-10-CM | POA: Insufficient documentation

## 2012-03-04 DIAGNOSIS — N390 Urinary tract infection, site not specified: Secondary | ICD-10-CM | POA: Insufficient documentation

## 2012-03-04 DIAGNOSIS — O418X90 Other specified disorders of amniotic fluid and membranes, unspecified trimester, not applicable or unspecified: Secondary | ICD-10-CM

## 2012-03-04 DIAGNOSIS — L293 Anogenital pruritus, unspecified: Secondary | ICD-10-CM | POA: Insufficient documentation

## 2012-03-04 LAB — URINE MICROSCOPIC-ADD ON

## 2012-03-04 LAB — URINALYSIS, ROUTINE W REFLEX MICROSCOPIC
Glucose, UA: NEGATIVE mg/dL
Ketones, ur: NEGATIVE mg/dL
Protein, ur: 30 mg/dL — AB
pH: 6 (ref 5.0–8.0)

## 2012-03-04 MED ORDER — FLUCONAZOLE 150 MG PO TABS: 150.0000 mg | ORAL_TABLET | Freq: Once | ORAL | Status: DC

## 2012-03-04 MED ORDER — SULFAMETHOXAZOLE-TRIMETHOPRIM 800-160 MG PO TABS: 1.0000 | ORAL_TABLET | Freq: Two times a day (BID) | ORAL | Status: AC

## 2012-03-04 NOTE — MAU Note (Signed)
Pt G5 P3 at 10.2wks started spotting 6/29, today bleeding like a period and having mild cramping.

## 2012-03-04 NOTE — Discharge Instructions (Signed)
Candidal Vulvovaginitis Candidal vulvovaginitis is an infection of the vagina and vulva. The vulva is the skin around the opening of the vagina. This may cause itching and discomfort in and around the vagina.  HOME CARE  Only take medicine as told by your doctor.   Do not have sex (intercourse) until the infection is healed or as told by your doctor.   Practice safe sex.   Tell your sex partner about your infection.   Do not douche or use tampons.   Wear cotton underwear. Do not wear tight pants or panty hose.   Eat yogurt. This may help treat and prevent yeast infections.  GET HELP RIGHT AWAY IF:   You have a fever.   Your problems get worse during treatment or do not get better in 3 days.   You have discomfort, irritation, or itching in your vagina or vulva area.   You have pain after sex.   You start to get belly (abdominal) pain.  MAKE SURE YOU:  Understand these instructions.   Will watch your condition.   Will get help right away if you are not doing well or get worse.  Document Released: 11/18/2008 Document Revised: 08/11/2011 Document Reviewed: 11/18/2008 [employer] Patient Information 2012 [employer].Candidal Vulvovaginitis Candidal vulvovaginitis is an infection of the vagina and vulva. The vulva is the skin around the opening of the vagina. This may cause itching and discomfort in and around the vagina.  HOME CARE  Only take medicine as told by your doctor.   Do not have sex (intercourse) until the infection is healed or as told by your doctor.   Practice safe sex.   Tell your sex partner about your infection.   Do not douche or use tampons.   Wear cotton underwear. Do not wear tight pants or panty hose.   Eat yogurt. This may help treat and prevent yeast infections.  GET HELP RIGHT AWAY IF:   You have a fever.   Your problems get worse during treatment or do not get better in 3 days.   You have discomfort, irritation, or itching in your vagina  or vulva area.   You have pain after sex.   You start to get belly (abdominal) pain.  MAKE SURE YOU:  Understand these instructions.   Will watch your condition.   Will get help right away if you are not doing well or get worse.  Document Released: 11/18/2008 Document Revised: 08/11/2011 Document Reviewed: 11/18/2008 [employer] Patient Information 2012 [employer]. 

## 2012-03-04 NOTE — MAU Provider Note (Signed)
History     CSN: [id number]  Arrival date & time 03/04/12  1843   None     Chief Complaint  Patient presents with  . Vaginal Bleeding  . Abdominal Cramping   HPI [name] is a 31 y.o. female @ [redacted]w[redacted]d gestation who presents to MAU for vaginal bleeding. She was evaluated here 4 weeks ago and had complete work up with ultrasound and labs. At that time the ultrasound showed a [facility]. Cultures for GC and Chlamydia were negative.  Patient had stopped bleeding until last night and has continued today. Last sexual intercourse 3 weeks ago. Started on Penicillin 4 days ago for a throat infection. Now having thick white vaginal discharge with itching. She has frequent urination and over the past couple days it has become painful. She has not started prenatal care but has an appointment with CC/OB. The history was provided by the patient.  Past Medical History  Diagnosis Date  . Anemia   . Pregnant     Past Surgical History  Procedure Date  . Cesarean section     History reviewed. No pertinent family history.  History  Substance Use Topics  . Smoking status: Former Smoker    Types: Cigarettes  . Smokeless tobacco: Not on file   Comment: patient states that she quit nd week of may2013  . Alcohol Use: No    OB History    Grav Para Term Preterm Abortions TAB SAB Ect Mult Living   5 3 3  1  1   3       Review of Systems  Constitutional: Positive for fatigue. Negative for fever, chills and diaphoresis.  HENT: Negative for ear pain, congestion, sore throat, facial swelling, neck pain, neck stiffness, dental problem and sinus pressure.   Eyes: Negative for photophobia, pain and discharge.  Respiratory: Negative for cough, chest tightness and wheezing.   Gastrointestinal: Positive for abdominal pain. Negative for nausea, vomiting, diarrhea, constipation and abdominal distention.  Genitourinary: Positive for dysuria, frequency, vaginal bleeding and vaginal discharge. Negative for flank  pain and difficulty urinating.  Musculoskeletal: Negative for myalgias, back pain and gait problem.  Skin: Negative for color change and rash.  Neurological: Negative for dizziness, speech difficulty, weakness, light-headedness, numbness and headaches.  Psychiatric/Behavioral: Negative for confusion and agitation.    Allergies  Review of patient's allergies indicates no known allergies.  Home Medications  No current outpatient prescriptions on file.  BP 120/55  Pulse 76  Temp 98.9 F (37.2 C) (Oral)  Resp 18  Ht 5\' 5"  (1.651 m)  Wt 236 lb 3.2 oz (107.14 kg)  BMI 39.31 kg/m2  LMP 12/20/2011  Physical Exam  Nursing note and vitals reviewed. Constitutional: She is oriented to person, place, and time. She appears well-developed and well-nourished. No distress.  HENT:  Head: Normocephalic.  Eyes: EOM are normal.  Neck: Neck supple.  Cardiovascular: Normal rate.   Pulmonary/Chest: Effort normal.  Abdominal: Soft. There is no tenderness. There is no CVA tenderness.       There is no tenderness with deep palpation.  Musculoskeletal: Normal range of motion.  Neurological: She is alert and oriented to person, place, and time. No cranial nerve deficit.  Skin: Skin is warm and dry.  Psychiatric: She has a normal mood and affect. Her behavior is normal. Judgment and thought content normal.   US Ob Transvaginal  03/04/2012  *RADIOLOGY REPORT*  Clinical Data: Pregnant, vaginal bleeding.  TRANSVAGINAL OBSTETRIC US  Technique:  Transvaginal ultrasound  was performed for complete evaluation of the gestation as well as the maternal uterus, adnexal regions, and pelvic cul-de-sac.  Comparison:  02/05/2012  Intrauterine gestational sac: Identified Yolk sac: Identified Embryo: Identified Cardiac Activity: Identified Heart Rate: 183  CRL: 37.9 mm 10   w  5   d          [place] EDC: 09/25/2012  Subchorionic hemorrhage: Small to moderate, measuring up to 4.4 x 1.4 cm.  Previously it measured the collection  at up to 3.6 x 1.6 cm.  Therefore, allowing for differences in positioning, not significantly changed.  Maternal uterus/adnexae: Corpus luteal cyst noted on the right.  Otherwise, normal sonographic appearance to the ovaries.  No free fluid.  IMPRESSION: Single intrauterine gestation with cardiac activity documented. Estimated age of 10 weeks 5 days by crown-rump length.  Small to moderate subchorionic hemorrhage.  Original Report Authenticated By: Waneta Martins, M.D.   Results for orders placed during the hospital encounter of 03/04/12 (from the past 24 hour(s))  URINALYSIS, ROUTINE W REFLEX MICROSCOPIC     Status: Abnormal   Collection Time   03/04/12  7:20 PM      Component Value Range   Color, Urine YELLOW  YELLOW   APPearance CLEAR  CLEAR   Specific Gravity, Urine >1.030 (*) 1.005 - 1.030   pH 6.0  5.0 - 8.0   Glucose, UA NEGATIVE  NEGATIVE mg/dL   Hgb urine dipstick LARGE (*) NEGATIVE   Bilirubin Urine NEGATIVE  NEGATIVE   Ketones, ur NEGATIVE  NEGATIVE mg/dL   Protein, ur 30 (*) NEGATIVE mg/dL   Urobilinogen, UA 1.0  0.0 - 1.0 mg/dL   Nitrite NEGATIVE  NEGATIVE   Leukocytes, UA SMALL (*) NEGATIVE  URINE MICROSCOPIC-ADD ON     Status: Abnormal   Collection Time   03/04/12  7:20 PM      Component Value Range   Squamous Epithelial / LPF FEW (*) RARE   WBC, UA 3-6  <3 WBC/hpf   RBC / HPF 11-20  <3 RBC/hpf   Bacteria, UA MANY (*) RARE   Urine-Other RARE YEAST      Assessment: 31 y.o. female with 10 week 5 day viable IUP   UTI in pregnancy   Monilia vaginosis  Plan:  Pelvic rest   Rx Septra DS bid x 5 days    Urine culture pending'   Diflucan 150 mg po now ED Course  Procedures  I have reviewed this patient's vital signs, nurses notes, appropriate labs and imaging. I have discussed results of the labs and ultrasound with the patient and reviewed plan of care. Patient voices understanding.  MDM

## 2012-03-06 LAB — URINE CULTURE: Special Requests: NORMAL

## 2012-03-07 ENCOUNTER — Encounter: Payer: Self-pay | Admitting: Obstetrics and Gynecology

## 2012-03-11 NOTE — MAU Provider Note (Signed)
Attestation of Attending Supervision of Advanced Practitioner: Evaluation and management procedures were performed by the PA/NP/CNM/OB Fellow under my supervision/collaboration. Chart reviewed and agree with management and plan.  Clebert Wenger V 03/11/2012 10:53 AM

## 2012-03-14 IMAGING — US US OB COMP LESS 14 WK
1 series · 14 of 23 positions shown · non-contrast
Comparison: None.

CLINICAL DATA: Cramping and discharge, estimated gestational age by
last menstrual period is 7 weeks 3 days

OBSTETRIC <14 WK ULTRASOUND
TECHNIQUE: Transabdominal ultrasound was performed for evaluation
of the gestation as well as the maternal uterus and adnexal
regions.

[Series 1: us ob comp less 14 wks · 0.21mm/px · 14 of 23 slices shown]
[im 1/23]
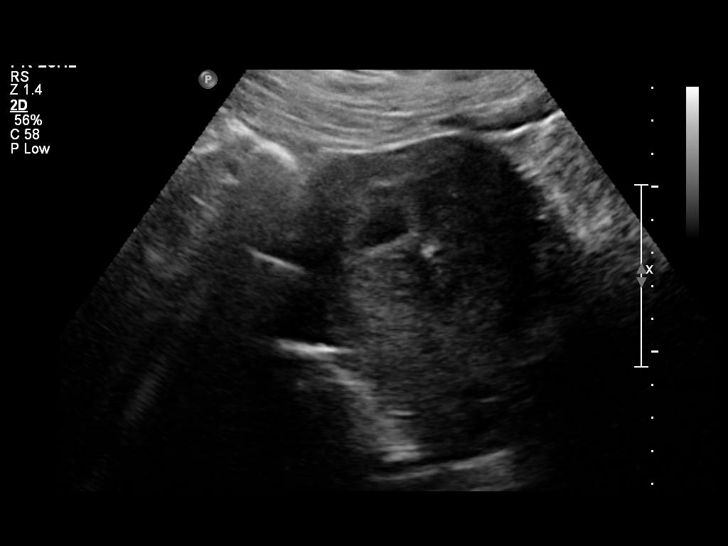
[im 3/23]
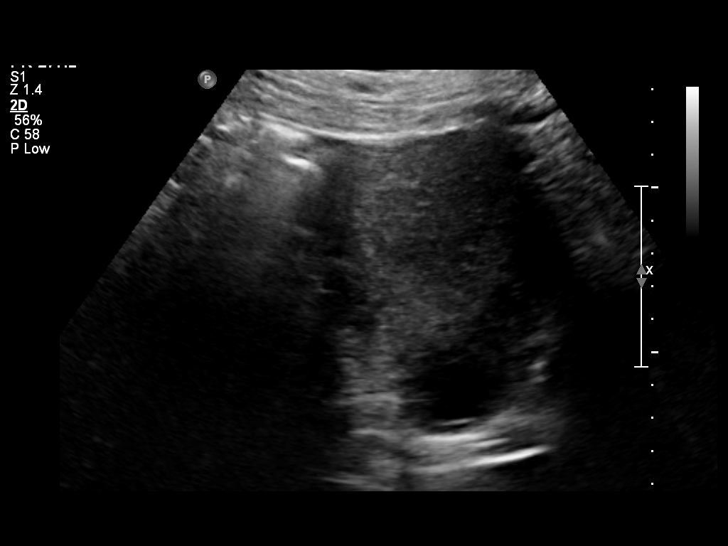
[im 5/23]
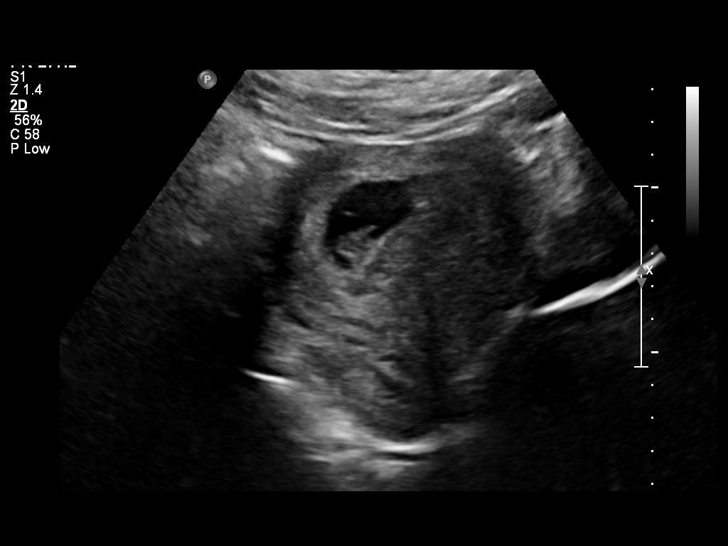
[im 6/23]
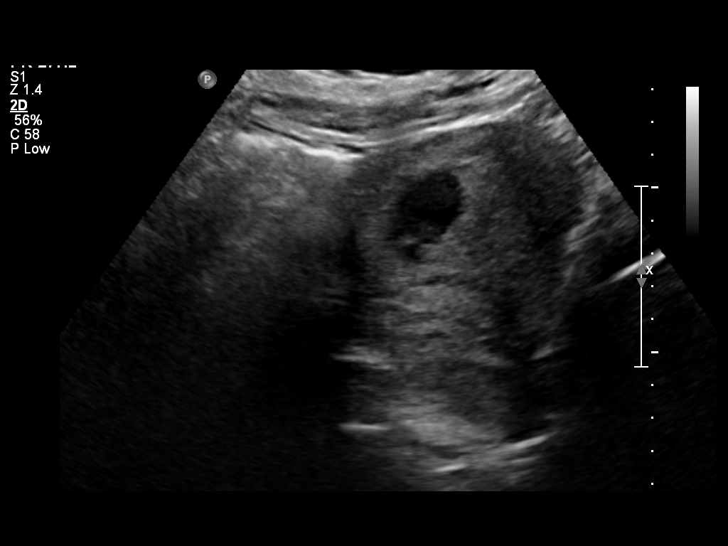
[im 8/23]
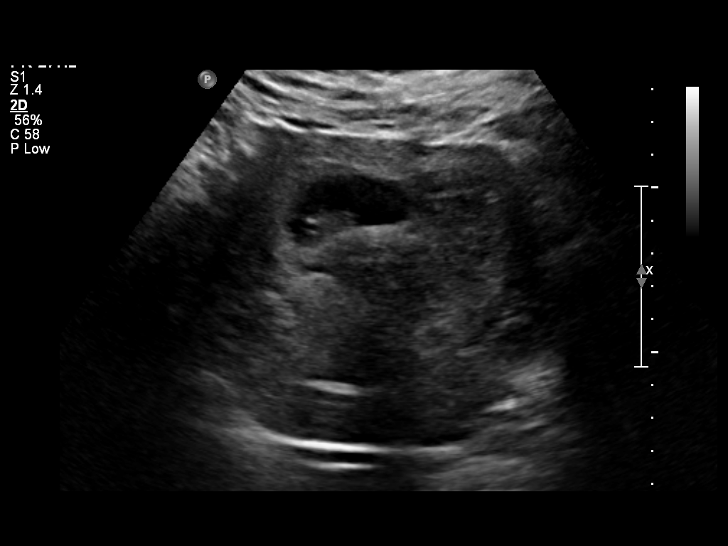
[im 10/23]
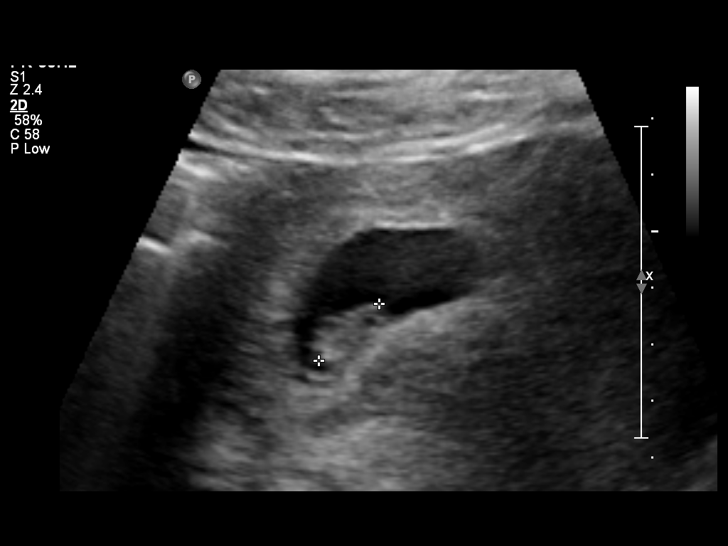
[im 11/23]
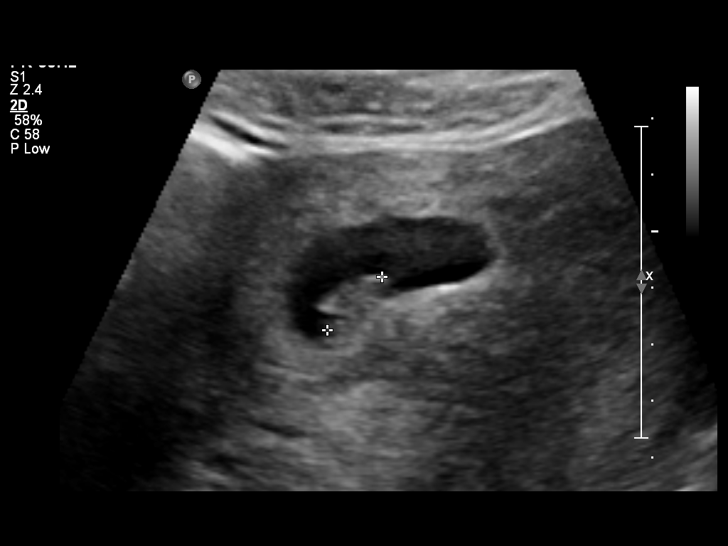
[im 13/23]
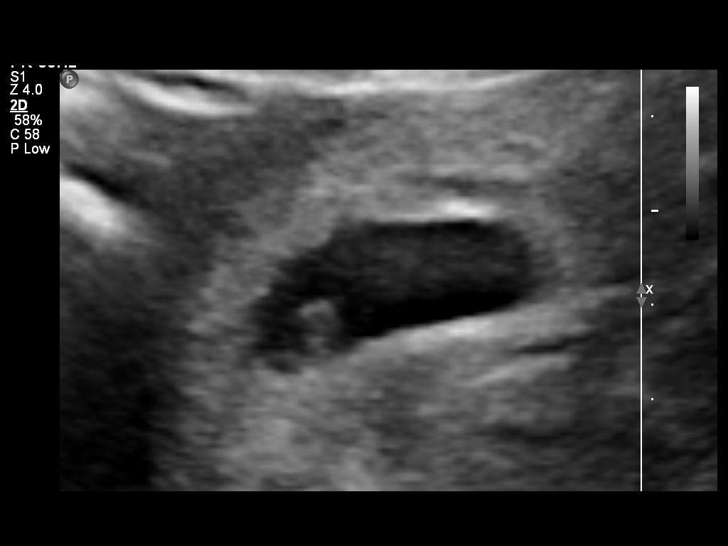
[im 14/23]
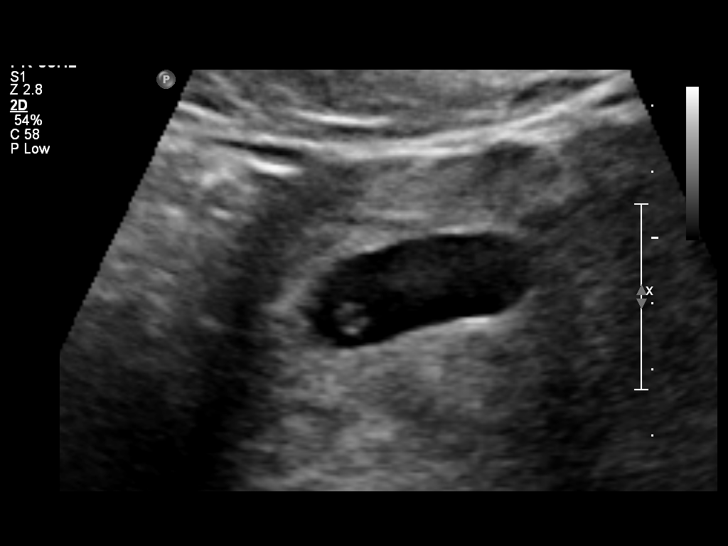
[im 16/23]
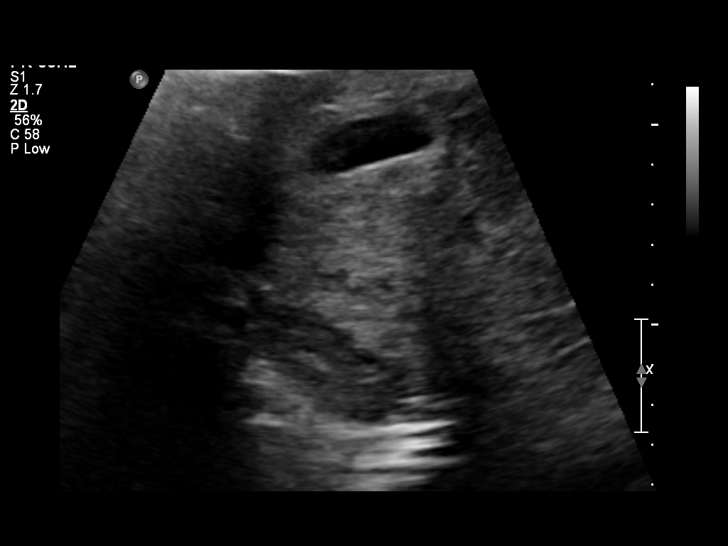
[im 18/23]
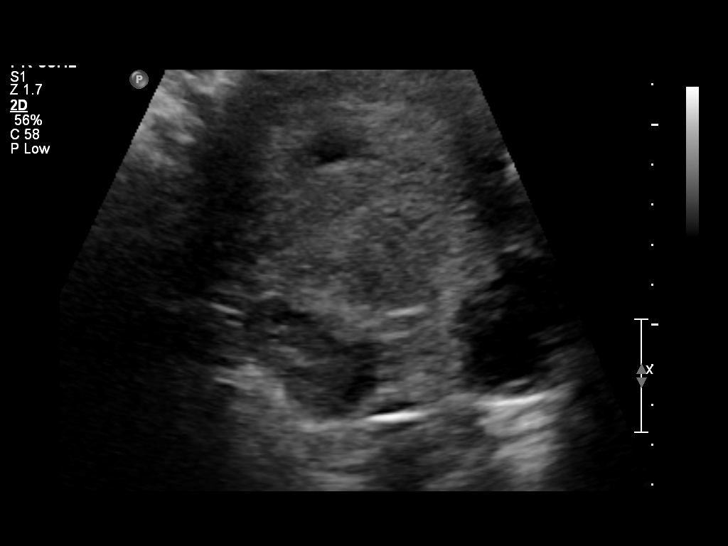
[im 19/23]
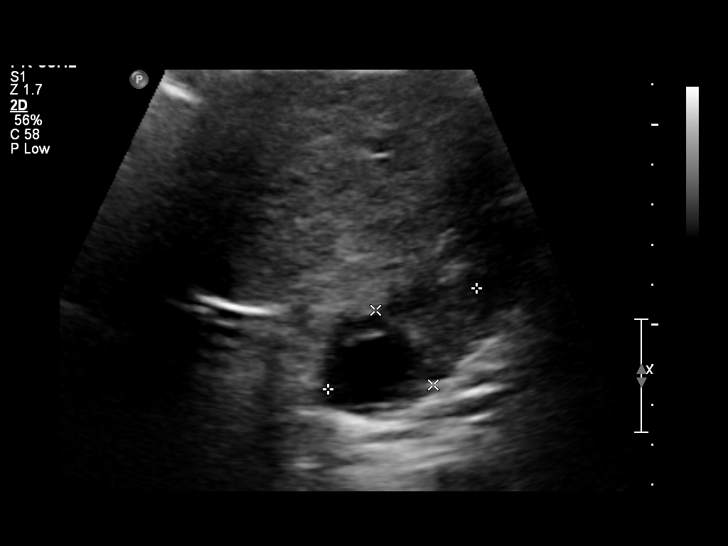
[im 21/23]
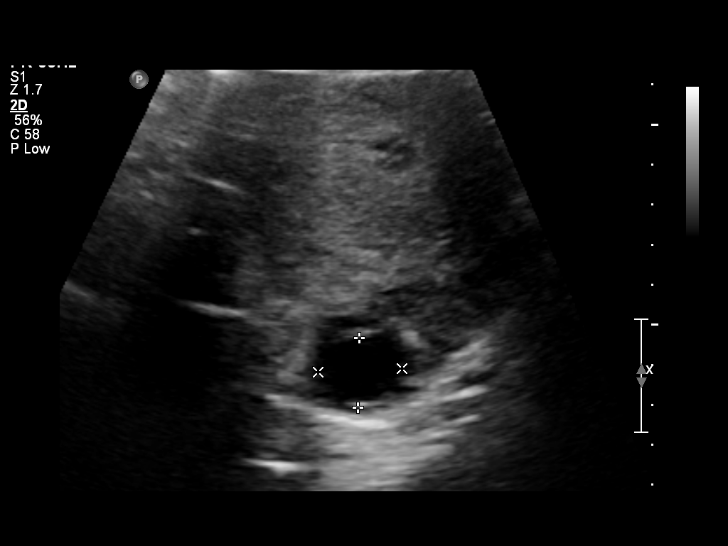
[im 23/23]
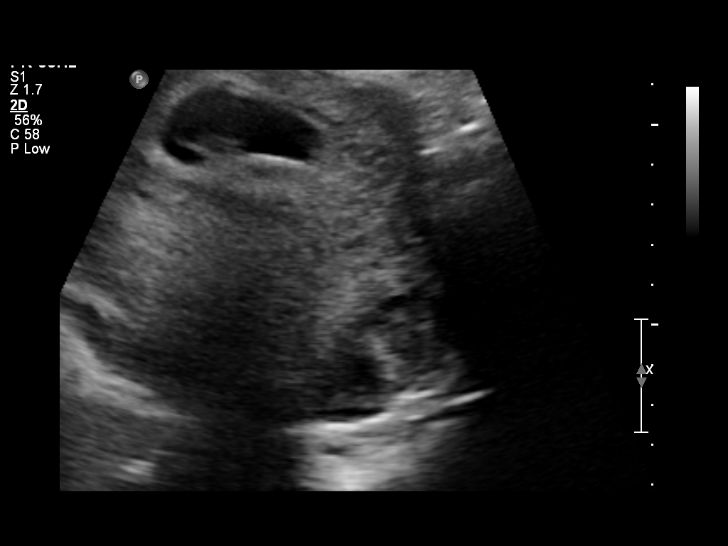

[14 of 23 positions shown; findings below may reference images not displayed]

Intrauterine gestational sac: Present  single
Yolk sac: Present
Embryo: Present
Cardiac Activity: Present
Heart Rate: 155 bpm

CRL:  14 mm  estimated gestational age by crown-rump length equals
7 weeks 5

Maternal uterus/Adnexae:
Corpus luteal cyst in the left ovary.  Right ovary is normal.  No
free fluid
IMPRESSION: 1.  Single intrauterine gestation with embryo and normal cardiac
activity.
2.  Estimated gestational age by crown-rump length equals 7 weeks 5
days.

## 2012-03-15 ENCOUNTER — Encounter: Payer: Self-pay | Admitting: Obstetrics and Gynecology

## 2012-03-20 ENCOUNTER — Ambulatory Visit (INDEPENDENT_AMBULATORY_CARE_PROVIDER_SITE_OTHER): Payer: Medicaid Other | Admitting: Obstetrics and Gynecology

## 2012-03-20 ENCOUNTER — Ambulatory Visit (INDEPENDENT_AMBULATORY_CARE_PROVIDER_SITE_OTHER): Payer: Medicaid Other

## 2012-03-20 ENCOUNTER — Other Ambulatory Visit: Payer: Self-pay

## 2012-03-20 DIAGNOSIS — Z36 Encounter for antenatal screening of mother: Secondary | ICD-10-CM

## 2012-03-20 DIAGNOSIS — Z331 Pregnant state, incidental: Secondary | ICD-10-CM

## 2012-03-20 LAB — POCT URINALYSIS DIPSTICK
Bilirubin, UA: NEGATIVE
Blood, UA: NEGATIVE
Ketones, UA: NEGATIVE
Spec Grav, UA: 1.015
pH, UA: 7

## 2012-03-20 NOTE — Progress Notes (Signed)
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 TO DISCUSS REFERRAL FOR COUNSELING AT [place]. STATES IS DOING WELL AT PRESENT TIME.

## 2012-03-21 DIAGNOSIS — O99891 Other specified diseases and conditions complicating pregnancy: Secondary | ICD-10-CM

## 2012-03-21 DIAGNOSIS — Z98891 History of uterine scar from previous surgery: Secondary | ICD-10-CM | POA: Insufficient documentation

## 2012-03-21 DIAGNOSIS — O09299 Supervision of pregnancy with other poor reproductive or obstetric history, unspecified trimester: Secondary | ICD-10-CM

## 2012-03-21 DIAGNOSIS — D649 Anemia, unspecified: Secondary | ICD-10-CM | POA: Insufficient documentation

## 2012-03-21 DIAGNOSIS — Z8659 Personal history of other mental and behavioral disorders: Secondary | ICD-10-CM

## 2012-03-21 DIAGNOSIS — O09219 Supervision of pregnancy with history of pre-term labor, unspecified trimester: Secondary | ICD-10-CM

## 2012-03-21 DIAGNOSIS — O9989 Other specified diseases and conditions complicating pregnancy, childbirth and the puerperium: Secondary | ICD-10-CM

## 2012-03-21 DIAGNOSIS — O09899 Supervision of other high risk pregnancies, unspecified trimester: Secondary | ICD-10-CM | POA: Insufficient documentation

## 2012-03-21 DIAGNOSIS — Z8632 Personal history of gestational diabetes: Secondary | ICD-10-CM

## 2012-03-21 HISTORY — DX: Other specified diseases and conditions complicating pregnancy: O99.891

## 2012-03-21 HISTORY — DX: Supervision of pregnancy with other poor reproductive or obstetric history, unspecified trimester: O09.299

## 2012-03-21 HISTORY — DX: Supervision of pregnancy with history of pre-term labor, unspecified trimester: O09.219

## 2012-03-21 HISTORY — DX: Personal history of other mental and behavioral disorders: Z86.59

## 2012-03-21 HISTORY — DX: Supervision of other high risk pregnancies, unspecified trimester: O09.899

## 2012-03-21 LAB — PRENATAL PANEL VII
Antibody Screen: NEGATIVE
Basophils Absolute: 0 10*3/uL (ref 0.0–0.1)
Eosinophils Relative: 1 % (ref 0–5)
HCT: 25.2 % — ABNORMAL LOW (ref 36.0–46.0)
HIV: NONREACTIVE
Hemoglobin: 7 g/dL — ABNORMAL LOW (ref 12.0–15.0)
Lymphocytes Relative: 33 % (ref 12–46)
MCHC: 27.8 g/dL — ABNORMAL LOW (ref 30.0–36.0)
MCV: 52.4 fL — ABNORMAL LOW (ref 78.0–100.0)
Monocytes Absolute: 0.4 10*3/uL (ref 0.1–1.0)
Monocytes Relative: 7 % (ref 3–12)
RDW: 21.6 % — ABNORMAL HIGH (ref 11.5–15.5)
Rh Type: POSITIVE
Rubella: 103.5 IU/mL — ABNORMAL HIGH
WBC: 5.5 10*3/uL (ref 4.0–10.5)

## 2012-04-02 ENCOUNTER — Encounter: Payer: Medicaid Other | Admitting: Obstetrics and Gynecology

## 2012-05-01 ENCOUNTER — Encounter: Payer: Self-pay | Admitting: Obstetrics and Gynecology

## 2012-05-01 ENCOUNTER — Ambulatory Visit (INDEPENDENT_AMBULATORY_CARE_PROVIDER_SITE_OTHER): Payer: Medicaid Other | Admitting: Obstetrics and Gynecology

## 2012-05-01 VITALS — BP 110/70 | Wt 235.0 lb

## 2012-05-01 DIAGNOSIS — Z3689 Encounter for other specified antenatal screening: Secondary | ICD-10-CM

## 2012-05-01 DIAGNOSIS — Z331 Pregnant state, incidental: Secondary | ICD-10-CM

## 2012-05-01 LAB — POCT WET PREP (WET MOUNT)

## 2012-05-01 NOTE — Progress Notes (Signed)
After NOB visit w/ Dr.Stringer pt was taken to "Lab Waiting Area" Per Clydie Braun from [place] made me aware Order was placed for AFP. But pt left w/o having blood drawn. We searched all waiting areas in the office.

## 2012-05-01 NOTE — Patient Instructions (Signed)
ABCs of Pregnancy A Antepartum care is very important. Be sure you see your doctor and get prenatal care as soon as you think you are pregnant. At this time, you will be tested for infection, genetic abnormalities and potential problems with you and the pregnancy. This is the time to discuss diet, exercise, work, medications, labor, pain medication during labor and the possibility of a cesarean delivery. Ask any questions that may concern you. It is important to see your doctor regularly throughout your pregnancy. Avoid exposure to toxic substances and chemicals - such as cleaning solvents, lead and mercury, some insecticides, and paint. Pregnant women should avoid exposure to paint fumes, and fumes that cause you to feel ill, dizzy or faint. When possible, it is a good idea to have a pre-pregnancy consultation with your caregiver to begin some important recommendations your caregiver suggests such as, taking folic acid, exercising, quitting smoking, avoiding alcoholic beverages, etc. B Breastfeeding is the healthiest choice for both you and your baby. It has many nutritional benefits for the baby and health benefits for the mother. It also creates a very tight and loving bond between the baby and mother. Talk to your doctor, your family and friends, and your employer about how you choose to feed your baby and how they can support you in your decision. Not all birth defects can be prevented, but a woman can take actions that may increase her chance of having a healthy baby. Many birth defects happen very early in pregnancy, sometimes before a woman even knows she is pregnant. Birth defects or abnormalities of any child in your or the father's family should be discussed with your caregiver. Get a good support bra as your breast size changes. Wear it especially when you exercise and when nursing.  C Celebrate the news of your pregnancy with the your spouse/father and family. Childbirth classes are helpful to  take for you and the spouse/father because it helps to understand what happens during the pregnancy, labor and delivery. Cesarean delivery should be discussed with your doctor so you are prepared for that possibility. The pros and cons of circumcision if it is a boy, should be discussed with your pediatrician. Cigarette smoking during pregnancy can result in low birth weight babies. It has been associated with infertility, miscarriages, tubal pregnancies, infant death (mortality) and poor health (morbidity) in childhood. Additionally, cigarette smoking may cause long-term learning disabilities. If you smoke, you should try to quit before getting pregnant and not smoke during the pregnancy. Secondary smoke may also harm a mother and her developing baby. It is a good idea to ask people to stop smoking around you during your pregnancy and after the baby is born. Extra calcium is necessary when you are pregnant and is found in your prenatal vitamin, in dairy products, green leafy vegetables and in calcium supplements. D A healthy diet according to your current weight and height, along with vitamins and mineral supplements should be discussed with your caregiver. Domestic abuse or violence should be made known to your doctor right away to get the situation corrected. Drink more water when you exercise to keep hydrated. Discomfort of your back and legs usually develops and progresses from the middle of the second trimester through to delivery of the baby. This is because of the enlarging baby and uterus, which may also affect your balance. Do not take illegal drugs. Illegal drugs can seriously harm the baby and you. Drink extra fluids (water is best) throughout pregnancy to help   your body keep up with the increases in your blood volume. Drink at least 6 to 8 glasses of water, fruit juice, or milk each day. A good way to know you are drinking enough fluid is when your urine looks almost like clear water or is very light  yellow.  E Eat healthy to get the nutrients you and your unborn baby need. Your meals should include the five basic food groups. Exercise (30 minutes of light to moderate exercise a day) is important and encouraged during pregnancy, if there are no medical problems or problems with the pregnancy. Exercise that causes discomfort or dizziness should be stopped and reported to your caregiver. Emotions during pregnancy can change from being ecstatic to depression and should be understood by you, your partner and your family. F Fetal screening with ultrasound, amniocentesis and monitoring during pregnancy and labor is common and sometimes necessary. Take 400 micrograms of folic acid daily both before, when possible, and during the first few months of pregnancy to reduce the risk of birth defects of the brain and spine. All women who could possibly become pregnant should take a vitamin with folic acid, every day. It is also important to eat a healthy diet with fortified foods (enriched grain products, including cereals, rice, breads, and pastas) and foods with natural sources of folate (orange juice, green leafy vegetables, beans, peanuts, broccoli, asparagus, peas, and lentils). The father should be involved with all aspects of the pregnancy including, the prenatal care, childbirth classes, labor, delivery, and postpartum time. Fathers may also have emotional concerns about being a father, financial needs, and raising a family. G Genetic testing should be done appropriately. It is important to know your family and the father's history. If there have been problems with pregnancies or birth defects in your family, report these to your doctor. Also, genetic counselors can talk with you about the information you might need in making decisions about having a family. You can call a major medical center in your area for help in finding a board-certified genetic counselor. Genetic testing and counseling should be done  before pregnancy when possible, especially if there is a history of problems in the mother's or father's family. Certain ethnic backgrounds are more at risk for genetic defects. H Get familiar with the hospital where you will be having your baby. Get to know how long it takes to get there, the labor and delivery area, and the hospital procedures. Be sure your medical insurance is accepted there. Get your home ready for the baby including, clothes, the baby's room (when possible), furniture and car seat. Hand washing is important throughout the day, especially after handling raw meat and poultry, changing the baby's diaper or using the bathroom. This can help prevent the spread of many bacteria and viruses that cause infection. Your hair may become dry and thinner, but will return to normal a few weeks after the baby is born. Heartburn is a common problem that can be treated by taking antacids recommended by your caregiver, eating smaller meals 5 or 6 times a day, not drinking liquids when eating, drinking between meals and raising the head of your bed 2 to 3 inches. I Insurance to cover you, the baby, doctor and hospital should be reviewed so that you will be prepared to pay any costs not covered by your insurance plan. If you do not have medical insurance, there are usually clinics and services available for you in your community. Take 30 milligrams of iron during   your pregnancy as prescribed by your doctor to reduce the risk of low red blood cells (anemia) later in pregnancy. All women of childbearing age should eat a diet rich in iron. J There should be a joint effort for the mother, father and any other children to adapt to the pregnancy financially, emotionally, and psychologically during the pregnancy. Join a support group for moms-to-be. Or, join a class on parenting or childbirth. Have the family participate when possible. K Know your limits. Let your caregiver know if you experience any of the  following:   Pain of any kind.   Strong cramps.   You develop a lot of weight in a short period of time (5 pounds in 3 to 5 days).   Vaginal bleeding, leaking of amniotic fluid.   Headache, vision problems.   Dizziness, fainting, shortness of breath.   Chest pain.   Fever of 102 F (38.9 C) or higher.   Gush of clear fluid from your vagina.   Painful urination.   Domestic violence.   Irregular heartbeat (palpitations).   Rapid beating of the heart (tachycardia).   Constant feeling sick to your stomach (nauseous) and vomiting.   Trouble walking, fluid retention (edema).   Muscle weakness.   If your baby has decreased activity.   Persistent diarrhea.   Abnormal vaginal discharge.   Uterine contractions at 20-minute intervals.   Back pain that travels down your leg.  L Learn and practice that what you eat and drink should be in moderation and healthy for you and your baby. Legal drugs such as alcohol and caffeine are important issues for pregnant women. There is no safe amount of alcohol a woman can drink while pregnant. Fetal alcohol syndrome, a disorder characterized by growth retardation, facial abnormalities, and central nervous system dysfunction, is caused by a woman's use of alcohol during pregnancy. Caffeine, found in tea, coffee, soft drinks and chocolate, should also be limited. Be sure to read labels when trying to cut down on caffeine during pregnancy. More than 200 foods, beverages, and over-the-counter medications contain caffeine and have a high salt content! There are coffees and teas that do not contain caffeine. M Medical conditions such as diabetes, epilepsy, and high blood pressure should be treated and kept under control before pregnancy when possible, but especially during pregnancy. Ask your caregiver about any medications that may need to be changed or adjusted during pregnancy. If you are currently taking any medications, ask your caregiver if it  is safe to take them while you are pregnant or before getting pregnant when possible. Also, be sure to discuss any herbs or vitamins you are taking. They are medicines, too! Discuss with your doctor all medications, prescribed and over-the-counter, that you are taking. During your prenatal visit, discuss the medications your doctor may give you during labor and delivery. N Never be afraid to ask your doctor or caregiver questions about your health, the progress of the pregnancy, family problems, stressful situations, and recommendation for a pediatrician, if you do not have one. It is better to take all precautions and discuss any questions or concerns you may have during your office visits. It is a good idea to write down your questions before you visit the doctor. O Over-the-counter cough and cold remedies may contain alcohol or other ingredients that should be avoided during pregnancy. Ask your caregiver about prescription, herbs or over-the-counter medications that you are taking or may consider taking while pregnant.  P Physical activity during pregnancy can   benefit both you and your baby by lessening discomfort and fatigue, providing a sense of well-being, and increasing the likelihood of early recovery after delivery. Light to moderate exercise during pregnancy strengthens the belly (abdominal) and back muscles. This helps improve posture. Practicing yoga, walking, swimming, and cycling on a stationary bicycle are usually safe exercises for pregnant women. Avoid scuba diving, exercise at high altitudes (over 3000 feet), skiing, horseback riding, contact sports, etc. Always check with your doctor before beginning any kind of exercise, especially during pregnancy and especially if you did not exercise before getting pregnant. Q Queasiness, stomach upset and morning sickness are common during pregnancy. Eating a couple of crackers or dry toast before getting out of bed. Foods that you normally love may  make you feel sick to your stomach. You may need to substitute other nutritious foods. Eating 5 or 6 small meals a day instead of 3 large ones may make you feel better. Do not drink with your meals, drink between meals. Questions that you have should be written down and asked during your prenatal visits. R Read about and make plans to baby-proof your home. There are important tips for making your home a safer environment for your baby. Review the tips and make your home safer for you and your baby. Read food labels regarding calories, salt and fat content in the food. S Saunas, hot tubs, and steam rooms should be avoided while you are pregnant. Excessive high heat may be harmful during your pregnancy. Your caregiver will screen and examine you for sexually transmitted diseases and genetic disorders during your prenatal visits. Learn the signs of labor. Sexual relations while pregnant is safe unless there is a medical or pregnancy problem and your caregiver advises against it. T Traveling long distances should be avoided especially in the third trimester of your pregnancy. If you do have to travel out of state, be sure to take a copy of your medical records and medical insurance plan with you. You should not travel long distances without seeing your doctor first. Most airlines will not allow you to travel after 36 weeks of pregnancy. Toxoplasmosis is an infection caused by a parasite that can seriously harm an unborn baby. Avoid eating undercooked meat and handling cat litter. Be sure to wear gloves when gardening. Tingling of the hands and fingers is not unusual and is due to fluid retention. This will go away after the baby is born. U Womb (uterus) size increases during the first trimester. Your kidneys will begin to function more efficiently. This may cause you to feel the need to urinate more often. You may also leak urine when sneezing, coughing or laughing. This is due to the growing uterus pressing  against your bladder, which lies directly in front of and slightly under the uterus during the first few months of pregnancy. If you experience burning along with frequency of urination or bloody urine, be sure to tell your doctor. The size of your uterus in the third trimester may cause a problem with your balance. It is advisable to maintain good posture and avoid wearing high heels during this time. An ultrasound of your baby may be necessary during your pregnancy and is safe for you and your baby. V Vaccinations are an important concern for pregnant women. Get needed vaccines before pregnancy. Center for Disease Control (www.cdc.gov) has clear guidelines for the use of vaccines during pregnancy. Review the list, be sure to discuss it with your doctor. Prenatal vitamins are helpful   and healthy for you and the baby. Do not take extra vitamins except what is recommended. Taking too much of certain vitamins can cause overdose problems. Continuous vomiting should be reported to your caregiver. Varicose veins may appear especially if there is a family history of varicose veins. They should subside after the delivery of the baby. Support hose helps if there is leg discomfort. W Being overweight or underweight during pregnancy may cause problems. Try to get within 15 pounds of your ideal weight before pregnancy. Remember, pregnancy is not a time to be dieting! Do not stop eating or start skipping meals as your weight increases. Both you and your baby need the calories and nutrition you receive from a healthy diet. Be sure to consult with your doctor about your diet. There is a formula and diet plan available depending on whether you are overweight or underweight. Your caregiver or nutritionist can help and advise you if necessary. X Avoid X-rays. If you must have dental work or diagnostic tests, tell your dentist or physician that you are pregnant so that extra care can be taken. X-rays should only be taken when  the risks of not taking them outweigh the risk of taking them. If needed, only the minimum amount of radiation should be used. When X-rays are necessary, protective lead shields should be used to cover areas of the body that are not being X-rayed. Y Your baby loves you. Breastfeeding your baby creates a loving and very close bond between the two of you. Give your baby a healthy environment to live in while you are pregnant. Infants and children require constant care and guidance. Their health and safety should be carefully watched at all times. After the baby is born, rest or take a nap when the baby is sleeping. Z Get your ZZZs. Be sure to get plenty of rest. Resting on your side as often as possible, especially on your left side is advised. It provides the best circulation to your baby and helps reduce swelling. Try taking a nap for 30 to 45 minutes in the afternoon when possible. After the baby is born rest or take a nap when the baby is sleeping. Try elevating your feet for that amount of time when possible. It helps the circulation in your legs and helps reduce swelling.  Most information courtesy of the CDC. Document Released: 08/22/2005 Document Revised: 08/11/2011 Document Reviewed: 05/06/2009 [employer] Patient Information 2012 [employer]. 

## 2012-05-01 NOTE — Progress Notes (Signed)
Pt states she feels pressure when urinating but is unable to leave urine. Pt also states she has frequency at night.

## 2012-05-01 NOTE — Progress Notes (Signed)
[facility] NEW OB EXAMINATION   [name] is a 31 y.o. female, [id number], who presents at [redacted]w[redacted]d gestation for a new obstetrical examination. The patient has been seen in the emergency department at the [facility].  She had first trimester bleeding.  This has now resolved.  The patient had one vaginal delivery at 36 weeks of a 7-1/2 pound infant. She has had 2 cesarean delivery since that time.  She wants a repeat cesarean section and tubal ligation.  The patient reports that her family is under lots of financial stress.  They are struggling to make ends meet.  The patient had severe postpartum depression after her last visit.  She reports that she is doing much better at this point.  The patient has a past history of gestational diabetes and macrosomia.  She has a past history of asthma, migraine headaches, anxiety, severe anemia, and fibroids.  She has had Lap Band surgery.  She has a past history of substance abuse.  Current medications: Prenatal vitamins, iron, occasional folic acid.  The following portions of the patient's history were reviewed and updated as appropriate: allergies, current medications, past family history, past medical history, past social history, past surgical history and problem list.  Drug allergies: Percocet  Review of systems: Stress  Social history: See above.  The patient is currently not using drugs.  Family history:the patient has a family history of heart disease, hypertension, pulmonary disease, diabetes, thyroid dysfunction, renal disease, cancer, and substance abuse.  She has a cousin with sickle cell disease.  Is Pregnant? Gravida Para Term Preterm Abortions SAB TAB Ectopic Multiple Living               Verboten!  Outcm Date GA Lbr. Len. Wgt Sex Del Anes PTL Lv  Name: "[name]" Site: [facility]  1 Preterm 06/2003 36w 0d  7 lb 5 oz F SVD EPI  Y  No additional information. 2 SAB 2005 4w 0d   U    N  Name: [name] Site: [facility]  GDM; PIH;IOL; FETAL DISTRESS  3 Term 12/2005 39w 0d 1h 10 lb 9 oz F LTCS EPI  Y  Name: [name] Apgar (1 min): 9 Apgar (5 min): 9 Site: [facility] Delivering Clinician: Dr, Pennie Rushing GDM; ANEMIA; PP DEPRESSION  4 Term 10/04/2010 40w 4d 1h 8 lb 2 oz M LTCS Spinal  Y   5 Current           No additional information.    Objective:    BP 110/70  Wt 235 lb (106.595 kg)  LMP 12/20/2011  Breastfeeding? Unknown    Weight:  Wt Readings from Last 1 Encounters:  05/01/12 235 lb (106.595 kg)          BMI: There is no height on file to calculate BMI.  Height: 65 inches  General Appearance: Alert, appropriate appearance for age. No acute distress HEENT: Grossly normal Neck / Thyroid: Supple, no masses, nodes or enlargement Lungs: clear to auscultation bilaterally Back: No CVA tenderness Breast Exam: No masses or nodes.No dimpling, nipple retraction or discharge. Cardiovascular: Regular rate and rhythm. S1, S2, no murmur Gastrointestinal: Soft, non-tender, no masses or organomegaly.                               Fundal height: 20 weeks  Fetal heart tones audible: yes  ++++++++++++++++++++++++++++++++++++++++++++++++++++++++  Pelvic Exam: External genitalia: normal general appearance Vaginal: normal without tenderness, induration or masses and relaxation: Yes Cervix: normal appearance Adnexa: normal bimanual exam Uterus: gravid, nontender, 20 weeks size  ++++++++++++++++++++++++++++++++++++++++++++++++++++++++  Lymphatic Exam: Non-palpable nodes in neck, clavicular, axillary, or inguinal regions Neurologic: Normal speech, no tremor  Psychiatric: Alert and oriented, appropriate affect.  Prenatal labs: ABO, Rh: A/POS/-- (07/16 1103) Antibody: NEG (07/16 1103) Rubella:   RPR: NON REAC (07/16 1103)  HBsAg: NEGATIVE (07/16 1103)  HIV: NON REACTIVE (07/16 1103)  GBS: not done yet First trimester screen within normal limits Sickle cell screen negative from prior  pregnancy Gonorrhea negative Chlamydia negative Urine culture negative  Wet prep     If no: Whiff:                     Negative                              Clue cells:             no                              PH:                        4.5                              Yeast:                    no                              Trichomoniasis:    no  Urine analysis:     Negative  CBC    Component Value Date/Time   WBC 5.5 03/20/2012 1103   RBC 4.81 03/20/2012 1103   HGB 7.0* 03/20/2012 1103   HCT 25.2* 03/20/2012 1103   PLT 494* 03/20/2012 1103   MCV 52.4* 03/20/2012 1103   MCH 14.6* 03/20/2012 1103   MCHC 27.8* 03/20/2012 1103   RDW 21.6* 03/20/2012 1103   LYMPHSABS 1.8 03/20/2012 1103   MONOABS 0.4 03/20/2012 1103   EOSABS 0.0 03/20/2012 1103   BASOSABS 0.0 03/20/2012 1103    03/04/2012 *RADIOLOGY REPORT* Clinical Data: Pregnant, vaginal bleeding. TRANSVAGINAL OBSTETRIC US Technique: Transvaginal ultrasound was performed for complete evaluation of the gestation as well as the maternal uterus, adnexal regions, and pelvic cul-de-sac. Comparison: 02/05/2012 Intrauterine gestational sac: Identified Yolk sac: Identified Embryo: Identified Cardiac Activity: Identified Heart Rate: 183 CRL: 37.9 mm 10 w 5 d [place] EDC: 09/25/2012 Subchorionic hemorrhage: Small to moderate, measuring up to 4.4 x 1.4 cm. Previously it measured the collection at up to 3.6 x 1.6 cm. Therefore, allowing for differences in positioning, not significantly changed. Maternal uterus/adnexae: Corpus luteal cyst noted on the right. Otherwise, normal sonographic appearance to the ovaries. No free fluid. IMPRESSION: Single intrauterine gestation with cardiac activity documented. Estimated age of 10 weeks 5 days by crown-rump length. Small to moderate subchorionic hemorrhage. Original Report Authenticated By: Waneta Martins, M.D.      Assessment:   31 y.o. female [id number] at [redacted]w[redacted]d gestation ( EDC is 09/25/12) by: LMP and [place]. Normal  Last  menstrual period: yes Ultrasound:                               yes                                 Previous cesarean section x2.  Desires repeat cesarean section and tubal ligation.  Desires sterilization.  Obesity.  History of gestational diabetes mellitus.  Severe anemia.  History of Lap Band surgery.  Anxiety.  Reported history of a preterm delivery at 36 weeks.  That baby weighed 7-1/2 pounds.  I do not think that that infant was preterm.  I do not believe that 17 P is appropriate.  Past history of substance abuse.  Financial stress.  History of first trimester bleeding.  Now resolved.  History of asthma.  History of fibroids.   Plan:    We discussed routine pregnancy issues:  Toxoplasmosis was reviewed.  The patient was told to avoid cat liter boxes and feces.  The patient was told to avoid predator fish including tuna because of our concerns for mercury consumption.  The patient was told to avoid soft cheeses.  The patient was told to be sure that all lunch meats are well cooked.  Genetic screening was discussed.  AFP screen today.  Proper diet and exercise reviewed.  Return to office in 2 weeks.  Medications include:  Prenatal vitamins Folic Acid  Ultrasound for anatomy next visit  The patient will be referred to a hematologist to help diminish her severe anemia.  ABC's of pregnancy given.  The VBAC consent form signed.  Pap smear sent.  Glucola screen next visit.  Mylinda Latina.D.

## 2012-05-02 ENCOUNTER — Telehealth: Payer: Self-pay | Admitting: Obstetrics and Gynecology

## 2012-05-03 LAB — PAP IG, CT-NG, RFX HPV ASCU
Chlamydia Probe Amp: NEGATIVE
GC Probe Amp: NEGATIVE

## 2012-05-09 ENCOUNTER — Telehealth: Payer: Self-pay | Admitting: Hematology & Oncology

## 2012-05-09 ENCOUNTER — Telehealth: Payer: Self-pay | Admitting: Oncology

## 2012-05-09 ENCOUNTER — Other Ambulatory Visit: Payer: Self-pay

## 2012-05-09 DIAGNOSIS — D649 Anemia, unspecified: Secondary | ICD-10-CM

## 2012-05-09 NOTE — Telephone Encounter (Signed)
S/w pt. In re  NP appt. 9/11@ 3:00 with Dr. Gaylyn Rong. Referring Dr. Stefano Gaul   Dx. Severe anemia np packet mailed

## 2012-05-09 NOTE — Telephone Encounter (Signed)
C/D on 9/4 for appt 9/11 °

## 2012-05-09 NOTE — Telephone Encounter (Signed)
Pt aware of 05-11-12 appointment

## 2012-05-09 NOTE — Telephone Encounter (Signed)
C/D on 9/4 for appt 9/11

## 2012-05-11 ENCOUNTER — Other Ambulatory Visit: Payer: Medicaid Other | Admitting: Lab

## 2012-05-11 ENCOUNTER — Encounter: Payer: Self-pay | Admitting: Oncology

## 2012-05-11 ENCOUNTER — Ambulatory Visit: Payer: Medicaid Other

## 2012-05-11 ENCOUNTER — Ambulatory Visit: Payer: Medicaid Other | Admitting: Medical

## 2012-05-11 DIAGNOSIS — D539 Nutritional anemia, unspecified: Secondary | ICD-10-CM

## 2012-05-11 HISTORY — DX: Nutritional anemia, unspecified: D53.9

## 2012-05-16 ENCOUNTER — Ambulatory Visit (HOSPITAL_BASED_OUTPATIENT_CLINIC_OR_DEPARTMENT_OTHER): Payer: Medicaid Other | Admitting: Oncology

## 2012-05-16 ENCOUNTER — Telehealth: Payer: Self-pay | Admitting: Oncology

## 2012-05-16 ENCOUNTER — Other Ambulatory Visit (HOSPITAL_BASED_OUTPATIENT_CLINIC_OR_DEPARTMENT_OTHER): Payer: Medicaid Other | Admitting: Lab

## 2012-05-16 ENCOUNTER — Encounter: Payer: Self-pay | Admitting: Oncology

## 2012-05-16 ENCOUNTER — Ambulatory Visit: Payer: Medicaid Other

## 2012-05-16 VITALS — BP 115/78 | HR 87 | Temp 98.5°F | Resp 20 | Ht 65.0 in | Wt 233.9 lb

## 2012-05-16 DIAGNOSIS — D649 Anemia, unspecified: Secondary | ICD-10-CM

## 2012-05-16 DIAGNOSIS — O99019 Anemia complicating pregnancy, unspecified trimester: Secondary | ICD-10-CM

## 2012-05-16 DIAGNOSIS — D539 Nutritional anemia, unspecified: Secondary | ICD-10-CM

## 2012-05-16 DIAGNOSIS — D509 Iron deficiency anemia, unspecified: Secondary | ICD-10-CM

## 2012-05-16 DIAGNOSIS — O24419 Gestational diabetes mellitus in pregnancy, unspecified control: Secondary | ICD-10-CM

## 2012-05-16 DIAGNOSIS — Z8632 Personal history of gestational diabetes: Secondary | ICD-10-CM

## 2012-05-16 LAB — CBC & DIFF AND RETIC
BASO%: 0.2 % (ref 0.0–2.0)
EOS%: 0.5 % (ref 0.0–7.0)
LYMPH%: 28.2 % (ref 14.0–49.7)
MCH: 15 pg — ABNORMAL LOW (ref 25.1–34.0)
MCHC: 27.8 g/dL — ABNORMAL LOW (ref 31.5–36.0)
MONO#: 0.4 10*3/uL (ref 0.1–0.9)
Platelets: 341 10*3/uL (ref 145–400)
RBC: 4.68 10*6/uL (ref 3.70–5.45)
Retic %: 1.62 % (ref 0.70–2.10)
WBC: 8.6 10*3/uL (ref 3.9–10.3)
lymph#: 2.4 10*3/uL (ref 0.9–3.3)

## 2012-05-16 LAB — COMPREHENSIVE METABOLIC PANEL (CC13)
Alkaline Phosphatase: 99 U/L (ref 40–150)
CO2: 20 mEq/L — ABNORMAL LOW (ref 22–29)
Creatinine: 0.6 mg/dL (ref 0.6–1.1)
Glucose: 84 mg/dl (ref 70–99)
Sodium: 135 mEq/L — ABNORMAL LOW (ref 136–145)
Total Bilirubin: 0.5 mg/dL (ref 0.20–1.20)
Total Protein: 6.5 g/dL (ref 6.4–8.3)

## 2012-05-16 LAB — LACTATE DEHYDROGENASE (CC13): LDH: 181 U/L (ref 125–220)

## 2012-05-16 NOTE — Telephone Encounter (Signed)
Gave pt appt for labs every 2 weeks and see ML, visit in December with labs

## 2012-05-16 NOTE — Patient Instructions (Addendum)
A.  Diagnosis:  Anemia most likely due to severe iron deficiency.  B.  Work up:  Pending iron panel to confirm this and also rule out other types of anemia:  VitB12 deficiency, hemolysis.  Is there family history of sickle cell disease?  We cannot test for thalassemia or other forms of hemoglobinopathy with severe iron deficiency due to false positive and negative result with severe iron deficiency.  C.  Treatments:  *  Oral iron in the form of SlowFe or NuIron twice a day along with either VitC or orange juice to improve absorption.  *  IV iron with your Ob/Gyn in the form of Feraheme 1020mg  IV x once which will improve your Hgb within the next few weeks.  Feraheme is often very well tolerated except for mild bone/muscle pain and low risk of infusion reaction.  IV iron cannot be given here at the Cancer Center due to potential risk of infusion reaction in which case you would have to be monitored at [facility] hospital.   D.  Follow up:    *  Once every 2 weeks follow up with blood count here at the Cancer Center to ensure improvement of Hgb.  *  Follow up with my Nurse Practitioner Clenton Pare in about 3 months.

## 2012-05-16 NOTE — Progress Notes (Signed)
[facility]  [phone] [phone]     INITIAL HEMATOLOGY CONSULTATION    Referral MD:  Dr. Kirkland Hun, M.D.  Reason for Referral: microcytic anemia.     HPI: [name] is a 31 year-old African American woman who is [phone]; now at week 21 of gestation.  She had history of anemia all her life.  She could not tell me for sure if it's iron deficiency or congenital hemoglobinopathy.  Her maternal grandfather was thought to have sickle cell trait.  She is not aware of any relative with sickle cell disease.  She herself was never tested for hemoglobinopathy.  She was advised multiple times in the past to take oral iron; however, she cannot tolerate it due to either constipation or nausea/vomiting.  She had a gastric lap banding procedure in 2010; and since then has had more problem taking oral iron.  She has never tried oral iron elixir.  However, she said that in the past, when she tried to be compliant with oral iron, she did not have significant improvement of her Hgb.  She did receive pRBC transfusion with her last two deliveries in 2007 and 2012.  She believed that she did receive IV iron before her 2012 delivery with some improvement in her Hgb.  Per EPIC, she has always been anemic.  The best Hgb was on 02/18/2010 when her Hgb was 8.8.  She recently established care with Dr. Stefano Gaul. Given her microcytic anemia, she was kindly referred for evaluation.   [name] presented to the clinic by herself.  She reported ice pica.  When she had IV iron in 2012, he ice pica improved.  She has had fatigue with the previous pregnancies; however, she has been more fatigue during this pregnancy than the previous 3.  She has some dizziness and mild SOB on heavy exertion.  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  She denied any visible source of bleeding now.  She did have 1st trimester bleeding which spontaneously resolved.     Patient denies fever, anorexia, headache, visual changes, confusion, drenching night sweats, palpable lymph node swelling, mucositis, odynophagia, dysphagia, nausea vomiting, jaundice, chest pain, productive cough, gum bleeding, epistaxis, hematemesis, hemoptysis, abdominal pain, abdominal swelling, early satiety, melena, hematochezia, hematuria, skin rash, spontaneous bleeding, joint swelling, joint pain, heat or cold intolerance, bowel bladder incontinence, back pain, focal motor weakness, paresthesia, depression.  She had severe post partum depression with her previous 3 deliveries.  She has established care with a local psychiatrist in town.      Past Medical History  Diagnosis Date  . Pregnant   . H/O varicella   . Yeast infection   . Bacterial infection   . Chlamydia infection   . BV (bacterial vaginosis) 2005  . Pelvic pain in female 2005  . Yeast vaginitis 2011  . Complication of anesthesia 2007    PROLONGED NUMBNESS WITH EPIDURAl  . Blood transfusion 2007    AFTER DELIVERY  . Blood transfusion 2012    AFTER DELIVERY  . Depression 2012    PP  . Anxiety     AS TEEN AFTER LOSS OF GM  . Pregnancy induced hypertension 2007  . Preterm labor 2004  . Anemia     LONG HISTORY; IV iron 2009  . History of gestational diabetes in prior pregnancy, currently pregnant 03/21/2012  . History of preterm delivery, currently pregnant 03/21/2012    G1=36 weeker  .  H/O precipitous labor and deliveries, antepartum 03/21/2012    1hr labors w/ last 2 deliveries  . History of postpartum depression, currently pregnant 03/21/2012  . Uterine fibroid   . Unspecified deficiency anemia 05/11/2012  . Blood transfusion, without reported diagnosis 2007; and 2012    with previous pregnancies.   . Gestational diabetes 2007  :    Past Surgical History  Procedure Date  . Cesarean section     x2 (2007; 2012)  . Dilation and curettage of uterus 2005    SAB  . Laparoscopic gastric banding 2010  .  Wisdom tooth extraction 2007;2012  :   CURRENT MEDS: Current Outpatient Prescriptions  Medication Sig Dispense Refill  . acetaminophen (TYLENOL) 500 MG tablet Take 500 mg by mouth every 6 (six) hours as needed. pain      . Prenat w/o A Vit-FeFum-FePo-FA (CONCEPT OB) 130-92.4-1 MG CAPS Take 1 tablet by mouth 1 day or 1 dose.  30 capsule  5      Allergies  Allergen Reactions  . Percocet (Oxycodone-Acetaminophen) Hives    hallucinations  :  Family History  Problem Relation Age of Onset  . Kidney disease Mother   . Parkinsonism Mother   . Seizures Sister   . Hypertension Maternal Aunt   . Kidney disease Maternal Aunt   . Kidney disease Maternal Uncle   . Arthritis Maternal Grandmother   . Depression Maternal Grandmother   . Vision loss Maternal Grandmother   . Hyperlipidemia Maternal Grandmother   . Hypertension Maternal Grandmother   . Rheum arthritis Maternal Grandmother   . Heart disease Maternal Grandfather   . Kidney disease Maternal Grandfather   . Pulmonary embolism Maternal Grandfather   . Sickle cell trait Maternal Grandfather   . Diabetes Father   :  History   Social History  . Marital Status: Married    Spouse Name: [facility] [place]    Number of Children: 3  . Years of Education: 17   Occupational History  . 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 Smoking status: Former Smoker    Types: Cigarettes    Quit date: 02/04/2012  . Smokeless tobacco: Never Used   Comment: patient states that she quit nd week of may2013  . Alcohol Use: No     ALCOLHOL ABUSE; LAST DRANK 2011  . Drug Use: No     LAST USE COCAINE 2011  . Sexually Active: Yes -- Female partner(s)    Birth Control/ Protection: None   Other Topics Concern  . Not on file   Social History Narrative  . No narrative on file  :  REVIEW OF SYSTEM:  The rest of the 14-point review of sytem was negative.   Exam: ECOG 1  General:  well-nourished woman in  no acute distress.  Eyes:  no scleral icterus.  ENT:  There were no oropharyngeal lesions.  Neck was without thyromegaly.  Lymphatics:  Negative cervical, supraclavicular or axillary adenopathy.  Respiratory: lungs were clear bilaterally without wheezing or crackles.  Cardiovascular:  Regular rate and rhythm, S1/S2, without murmur, rub or gallop.  There was no pedal edema.   Muscoloskeletal:  no spinal tenderness of palpation of vertebral spine.  Skin exam was without echymosis, petichae.  Neuro exam was nonfocal.  Patient was able to get on and off exam table without assistance.  Gait was normal.  Patient was alerted and oriented.  Attention was good.  Language was appropriate.  Mood was normal without depression.  Speech was not pressured.  Thought content was not tangential.    LABS:  Lab Results  Component Value Date   WBC 5.5 03/20/2012   HGB 7.0* 03/20/2012   HCT 25.2* 03/20/2012   PLT 494* 03/20/2012   GLUCOSE 112* 09/20/2010   ALT 9 09/11/2010   AST 13 09/11/2010   NA 136 09/20/2010   K 3.8 09/20/2010   CL 106 09/20/2010   CREATININE 0.55 09/20/2010   BUN 4* 09/20/2010   CO2 24 09/20/2010    No results found.  Blood smear review:   I personally reviewed the patient's peripheral blood smear today.  There was anisocytosis.  There was marked increased in central pallor.  There were cigar-shaped cells.  There was no sickle cells.  There was no peripheral blast.  There was no schistocytosis, spherocytosis, target cell, rouleaux formation, tear drop cell.  There was no giant platelets or platelet clumps.      ASSESSMENT AND PLAN:   1.  History of gestational diabetes.  2.  History of post partum depression.  3.  Chronic microcytic anemia:  Reportedly lifelong; per record available here at [facility], at least since 2011.   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  Pending iron panel to confirm this and also rule out other types of  anemia:  VitB12 deficiency, hemolysis.  There is family  History of sickle trait but no disease.  Her clinical history and blood smear review today did not show sign consistent with sickle cell disease.   We cannot test for thalassemia or other forms of hemoglobinopathy with severe iron deficiency due to false positive and negative result with severe iron deficiency.   -  Treatments:  *  Oral iron in the form of SlowFe or NuIron twice a day along with either VitC or orange juice to improve absorption.  She refused to try oral iron since it only gave her side effects and did not work in the past.   *  IV iron in the form of Feraheme 1020mg  IV x once which will improve her Hgb within the next few weeks.  Feraheme is often very well tolerated except for mild bone/muscle pain and low risk of infusion reaction.  IV iron cannot be given here at the Cancer [facility] due to potential risk of infusion reaction in which case patient would have to be monitored at [facility] hospital.  I sent an EPIC message to Dr. Stefano Gaul asking for his assistance to order this to be administered at [facility] hospital   4.  Follow up:    *  Once every 2 weeks follow up with blood count here at the Cancer Center to ensure improvement of Hgb.  *  Follow up with my Nurse Practitioner Clenton Pare in about 3 months.     Thank you for this referral.    The length of time of the face-to-face encounter was 45 minutes. More than 50% of time was spent counseling and coordination of care.

## 2012-05-18 LAB — IRON AND TIBC
%SAT: 5 % — ABNORMAL LOW (ref 20–55)
Iron: 18 ug/dL — ABNORMAL LOW (ref 42–145)
TIBC: 388 ug/dL (ref 250–470)
UIBC: 370 ug/dL (ref 125–400)

## 2012-05-18 LAB — FERRITIN: Ferritin: 2 ng/mL — ABNORMAL LOW (ref 10–291)

## 2012-05-18 LAB — METHYLMALONIC ACID, SERUM: Methylmalonic Acid, Quant: <0.09 umol/L (ref <0.40)

## 2012-05-21 ENCOUNTER — Other Ambulatory Visit: Payer: Self-pay | Admitting: Oncology

## 2012-05-23 ENCOUNTER — Telehealth: Payer: Self-pay | Admitting: Obstetrics and Gynecology

## 2012-05-23 ENCOUNTER — Telehealth: Payer: Self-pay | Admitting: *Deleted

## 2012-05-23 ENCOUNTER — Other Ambulatory Visit: Payer: Self-pay | Admitting: Obstetrics and Gynecology

## 2012-05-23 NOTE — Telephone Encounter (Signed)
Feraheme  1020mg  IV ordered by AVS for patient.  Scheduled for patient tonight at 5:00 however patient states that does not work for her due to late notice, full time employment, the fact that she has 3 kids and decreased energy. I tried to explain to the patient that this would help her with her energy issues.  Patient states she would like to have it done Friday after 3:00.  Belenda Cruise with [facility] states the patient should call ahead on Friday to [phone] to check to see if there is a bed available. After MULTIPLE attempts to reach the patient who repeatedly sent me to voicemail. I am leaving this correspondence on the patients chart.  Please communicate the information to the patient.  -Adrianne Pridgen

## 2012-05-24 ENCOUNTER — Other Ambulatory Visit: Payer: Medicaid Other

## 2012-05-24 ENCOUNTER — Ambulatory Visit (INDEPENDENT_AMBULATORY_CARE_PROVIDER_SITE_OTHER): Payer: Medicaid Other

## 2012-05-24 ENCOUNTER — Encounter: Payer: Self-pay | Admitting: Obstetrics and Gynecology

## 2012-05-24 ENCOUNTER — Ambulatory Visit (INDEPENDENT_AMBULATORY_CARE_PROVIDER_SITE_OTHER): Payer: Medicaid Other | Admitting: Obstetrics and Gynecology

## 2012-05-24 VITALS — BP 128/68 | Wt 235.0 lb

## 2012-05-24 DIAGNOSIS — O4410 Placenta previa with hemorrhage, unspecified trimester: Secondary | ICD-10-CM

## 2012-05-24 DIAGNOSIS — Z3689 Encounter for other specified antenatal screening: Secondary | ICD-10-CM

## 2012-05-24 DIAGNOSIS — Z331 Pregnant state, incidental: Secondary | ICD-10-CM

## 2012-05-24 DIAGNOSIS — O4400 Placenta previa specified as without hemorrhage, unspecified trimester: Secondary | ICD-10-CM | POA: Insufficient documentation

## 2012-05-24 DIAGNOSIS — IMO0002 Reserved for concepts with insufficient information to code with codable children: Secondary | ICD-10-CM

## 2012-05-24 DIAGNOSIS — D649 Anemia, unspecified: Secondary | ICD-10-CM

## 2012-05-24 LAB — US OB COMP + 14 WK

## 2012-05-24 NOTE — Telephone Encounter (Signed)
Left VM for pt asking if she was able to get her IV iron scheduled at [facility]?  Asked her to call us back and let us know.  Dr. Gaylyn Rong wants to make sure she is able to get [facility].  Telephone note from Dr. Debria Garret office they are trying to reach pt to get this scheduled.

## 2012-05-24 NOTE — Progress Notes (Signed)
22w2 PT HAS NO CONCERNS.  Ultrasound: Breech presentation. Posterior placenta. Question of marginal cord insertion. Low lying placenta vs marginal previa.  Normal fluid. AP pocket = 4.4 cm Fetal biometry is 1 week 3 days less than LMP GA. Correlate clinical and with first ultrasound for best EDD. Anatomy not seen: profile, face, NB, LVOT, RVOT, DA, AA Technically difficult study due to habitus. Suggest f/u to complete anatomy and revaluate placenta/cord.  cx closed. Normal ovaries/adnexa.

## 2012-05-24 NOTE — Progress Notes (Signed)
Patient ID: [name], female   DOB: 06-Mar-1981, 31 y.o.   MRN: [id number] [redacted]w[redacted]d Discussed marginal placentat low lying vx marginal previa, limit activity, no intercouse, no heavy lifting, no vag exams, s/s bleeding report immediately, risk for hemorrhage, f/o [place] 4 weeks with 1 gtt for iron IV on Friday discussed. Lavera Guise, CNM

## 2012-05-25 LAB — ALPHA FETOPROTEIN, MATERNAL
AFP: 26.1 IU/mL
Curr Gest Age: 20.6 wks.days
MoM for AFP: 0.61
Open Spina bifida: NEGATIVE
Osb Risk: <1:54600 {titer}

## 2012-05-28 ENCOUNTER — Encounter: Payer: Medicaid Other | Admitting: Obstetrics and Gynecology

## 2012-05-30 ENCOUNTER — Other Ambulatory Visit: Payer: Medicaid Other | Admitting: Lab

## 2012-06-05 ENCOUNTER — Inpatient Hospital Stay (HOSPITAL_COMMUNITY)
Admission: AD | Admit: 2012-06-05 | Discharge: 2012-06-05 | Disposition: A | Discharge status: Home / Self Care | Payer: Medicaid Other | Source: Ambulatory Visit | Attending: Obstetrics and Gynecology | Admitting: Obstetrics and Gynecology

## 2012-06-05 DIAGNOSIS — O99340 Other mental disorders complicating pregnancy, unspecified trimester: Secondary | ICD-10-CM

## 2012-06-05 DIAGNOSIS — R109 Unspecified abdominal pain: Secondary | ICD-10-CM | POA: Insufficient documentation

## 2012-06-05 DIAGNOSIS — F411 Generalized anxiety disorder: Secondary | ICD-10-CM | POA: Insufficient documentation

## 2012-06-05 DIAGNOSIS — O4700 False labor before 37 completed weeks of gestation, unspecified trimester: Secondary | ICD-10-CM | POA: Insufficient documentation

## 2012-06-05 DIAGNOSIS — O99019 Anemia complicating pregnancy, unspecified trimester: Secondary | ICD-10-CM

## 2012-06-05 LAB — URINALYSIS, ROUTINE W REFLEX MICROSCOPIC
Ketones, ur: NEGATIVE mg/dL
Nitrite: NEGATIVE
Protein, ur: NEGATIVE mg/dL

## 2012-06-05 MED ORDER — ONDANSETRON 4 MG PO TBDP
4.0000 mg | ORAL_TABLET | Freq: Once | ORAL | Status: AC
  Administered 2012-06-05: 4 mg via ORAL
  Filled 2012-06-05 (×2): qty 1

## 2012-06-05 MED ORDER — FAMOTIDINE IN NACL 20-0.9 MG/50ML-% IV SOLN: 20.0000 mg | Freq: Once | INTRAVENOUS | Status: DC

## 2012-06-05 MED ORDER — FAMOTIDINE 20 MG PO TABS
20.0000 mg | ORAL_TABLET | Freq: Once | ORAL | Status: AC
  Administered 2012-06-05: 20 mg via ORAL
  Filled 2012-06-05: qty 1

## 2012-06-05 MED ORDER — SERTRALINE HCL 25 MG PO TABS
25.0000 mg | ORAL_TABLET | Freq: Every day | ORAL | Status: DC
  Administered 2012-06-05: 25 mg via ORAL
  Filled 2012-06-05 (×2): qty 1

## 2012-06-05 MED ORDER — ONDANSETRON HCL 4 MG/2ML IJ SOLN: 4.0000 mg | Freq: Once | INTRAMUSCULAR | Status: DC

## 2012-06-05 MED ORDER — SERTRALINE HCL 25 MG PO TABS: 25.0000 mg | ORAL_TABLET | Freq: Every day | ORAL | Status: DC

## 2012-06-05 MED ORDER — LACTATED RINGERS IV SOLN: Freq: Once | INTRAVENOUS | Status: DC

## 2012-06-05 MED ORDER — ONDANSETRON 4 MG PO TBDP: 4.0000 mg | ORAL_TABLET | Freq: Once | ORAL | Status: DC

## 2012-06-05 MED ORDER — LACTATED RINGERS IV BOLUS (SEPSIS): 500.0000 mL | Freq: Once | INTRAVENOUS | Status: DC

## 2012-06-05 MED ORDER — ONDANSETRON HCL 4 MG/2ML IJ SOLN
4.0000 mg | Freq: Once | INTRAMUSCULAR | Status: DC
  Filled 2012-06-05: qty 2

## 2012-06-05 NOTE — Progress Notes (Signed)
Dee davies cnm notified of patient and her complains. Order to administer bolus LR. And 4mg  zofran iv push

## 2012-06-05 NOTE — MAU Provider Note (Signed)
History   This patient presented with Stress and anxiety s/p verbal altercation with Employer who has been with holding wages on payday. The patient describes having cramping after stressful event and describes anxiety over work, home and fear for postpartum depression s/p last pregnancy. She states "Not Coping Well". Her husband has accompainied her to this MAU visit and has advised the patient that she she does not need to go back to her work situation due to stress levels. 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  Hgb: 7.2   CSN: [id number]  Arrival date and time: 06/05/12 1015   None     Chief Complaint  Patient presents with  . Abdominal Cramping  . Stress   HPI  OB History    Grav Para Term Preterm Abortions TAB SAB Ect Mult Living   5 3 2 1 1  1   3      Obstetric Comments   PP DEPRESSION; NO MEDS      Past Medical History  Diagnosis Date  . Pregnant   . H/O varicella   . Yeast infection   . Bacterial infection   . Chlamydia infection   . BV (bacterial vaginosis) 2005  . Pelvic pain in female 2005  . Yeast vaginitis 2011  . Complication of anesthesia 2007    PROLONGED NUMBNESS WITH EPIDURAl  . Blood transfusion 2007    AFTER DELIVERY  . Blood transfusion 2012    AFTER DELIVERY  . Depression 2012    PP  . Anxiety     AS TEEN AFTER LOSS OF GM  . Pregnancy induced hypertension 2007  . Preterm labor 2004  . Anemia     LONG HISTORY; IV iron 2009  . History of gestational diabetes in prior pregnancy, currently pregnant 03/21/2012  . History of preterm delivery, currently pregnant 03/21/2012    G1=36 weeker  . H/O precipitous labor and deliveries, antepartum 03/21/2012    1hr labors w/ last 2 deliveries  . History of postpartum depression, currently pregnant 03/21/2012  . Uterine fibroid   . Unspecified deficiency anemia 05/11/2012  . Blood transfusion, without reported diagnosis  2007; and 2012    with previous pregnancies.   . Gestational diabetes 2007  . Placenta previa     Past Surgical History  Procedure Date  . Cesarean section     x2 (2007; 2012)  . Dilation and curettage of uterus 2005    SAB  . Laparoscopic gastric banding 2010  . Wisdom tooth extraction 2007;2012    Family History  Problem Relation Age of Onset  . Kidney disease Mother   . Parkinsonism Mother   . Seizures Sister   . Hypertension Maternal Aunt   . Kidney disease Maternal Aunt   . Kidney disease Maternal Uncle   . Arthritis Maternal Grandmother   . Depression Maternal Grandmother   . Vision loss Maternal Grandmother   . Hyperlipidemia Maternal Grandmother   . Hypertension Maternal Grandmother   . Rheum arthritis Maternal Grandmother   . Heart disease Maternal Grandfather   . Kidney disease Maternal Grandfather   . Pulmonary embolism Maternal Grandfather   . Sickle cell trait Maternal Grandfather   . Diabetes Father     History  Substance Use Topics  . Smoking status: Former Smoker    Types: Cigarettes    Quit date: 02/04/2012  . Smokeless tobacco: Never Used   Comment: patient  states that she quit nd week of [id number]  . Alcohol Use: No     ALCOLHOL ABUSE; LAST DRANK 2011    Allergies:  Allergies  Allergen Reactions  . Percocet (Oxycodone-Acetaminophen) Hives    hallucinations    Prescriptions prior to admission  Medication Sig Dispense Refill  . acetaminophen (TYLENOL) 500 MG tablet Take 500 mg by mouth every 6 (six) hours as needed. pain      . Prenatal Vit-Fe Fumarate-FA (PRENATAL MULTIVITAMIN) TABS Take 1 tablet by mouth daily.        Review of Systems  Constitutional: Negative.   HENT: Negative.   Eyes: Negative.   Respiratory: Negative.   Cardiovascular: Negative.   Gastrointestinal: Negative.   Genitourinary: Negative.   Musculoskeletal: Negative.   Skin: Negative.   Neurological: Negative.   Endo/Heme/Allergies: Negative.     Psychiatric/Behavioral: The patient is nervous/anxious.    Physical Exam   Blood pressure 122/62, pulse 84, temperature 98.2 F (36.8 C), temperature source Oral, resp. rate 16, height 5\' 5"  (1.651 m), weight 233 lb 2 oz (105.745 kg), last menstrual period 12/20/2011, not currently breastfeeding.  Physical Exam  Constitutional: She is oriented to person, place, and time. She appears well-developed.  HENT:  Head: Normocephalic and atraumatic.  Right Ear: External ear normal.  Eyes: Conjunctivae normal and EOM are normal. Pupils are equal, round, and reactive to light.  Neck: Normal range of motion. Neck supple.  Cardiovascular: Normal rate, regular rhythm and normal heart sounds.   Respiratory: Effort normal and breath sounds normal.  GI: Soft. Bowel sounds are normal.  Genitourinary: Vagina normal and uterus normal.       C/o some uterine contractions  Musculoskeletal: Normal range of motion.  Neurological: She is alert and oriented to person, place, and time. She has normal reflexes.  Skin: Skin is warm and dry.  Psychiatric:       Anxious and agitated   History of severe Anemia. Hgb : 7.2. For iron infusion scheduled for Hospital IV infusion History of Marginal previa  MAU Course  Procedures P/O Hydration, Zofran 4 mgs OTD  x1and Pepcid  20mg   Po x 1, Zoloft 25mg  po x 1 EFM while in MAU to observe for CTX  Assessment and Plan  Anxiety and PT CTX. The patient has been started on Zoloft 25 mgs po daily x 7 days and then will increase dosage to Zoloft 50 mgs po daily. The patient also complains of acid reflux and will commence ob OTC Zantac 150 mgs po BID or as PRN For Iron Infusion at [facility] in am as Hgb 7.2. Severe anemia may be attributed to Lap Band that the patient has in situ. Advised to use Floridix po Liquid Iron 10ml po BID in OJ or mango juice. Agreed to plan. F/U [facility] 06/21/12      [name] , CNM 06/05/2012, 11:20 AM

## 2012-06-05 NOTE — MAU Note (Signed)
Pt states having lower abdominal cramping that began when pt and her boss began arguing around 0850. Pt states boss gives her a lot of issues related to payday. Felt like she would have panic attack. Hx anxiety. Denies bleeding or abnormal vaginal discharge. Has placenta previa. Wants iron treatment while she is here.

## 2012-06-06 ENCOUNTER — Other Ambulatory Visit: Payer: Medicaid Other | Admitting: Lab

## 2012-06-13 ENCOUNTER — Other Ambulatory Visit: Payer: Medicaid Other | Admitting: Lab

## 2012-06-13 ENCOUNTER — Telehealth: Payer: Self-pay | Admitting: Obstetrics and Gynecology

## 2012-06-13 NOTE — Telephone Encounter (Signed)
TC from patient--25 weeks, with dysuria and frequency. No fever, cramping, bleeding, d/c. Had similar sx at 4 months, no treatment.  Recommended MAU visit for UA and culture. Patient declines, due to currently being at daughter's birthday parth. Prefers office visit in AM--can come at 9am. Will push po fluids, can use Azo Standard tonight. To call during the night with any worsening of sx. Left message with triage that patient would be coming in the am for UA and culture.  Nigel Bridgeman, CNM

## 2012-06-14 ENCOUNTER — Telehealth: Payer: Self-pay | Admitting: Obstetrics and Gynecology

## 2012-06-14 ENCOUNTER — Encounter: Payer: Medicaid Other | Admitting: Obstetrics and Gynecology

## 2012-06-14 ENCOUNTER — Other Ambulatory Visit (INDEPENDENT_AMBULATORY_CARE_PROVIDER_SITE_OTHER): Payer: Medicaid Other

## 2012-06-14 DIAGNOSIS — R300 Dysuria: Secondary | ICD-10-CM

## 2012-06-14 DIAGNOSIS — R350 Frequency of micturition: Secondary | ICD-10-CM

## 2012-06-14 LAB — POCT URINALYSIS DIPSTICK

## 2012-06-14 NOTE — Telephone Encounter (Signed)
Tc to pt regarding msg.  Advised pt ok to come in for UA today per VL note last pm, pt will come in @ 1615 to leave urine sample.

## 2012-06-18 ENCOUNTER — Telehealth: Payer: Self-pay | Admitting: Obstetrics and Gynecology

## 2012-06-18 NOTE — Telephone Encounter (Signed)
Spoke with pt rgd msg pt wants abx for uti advised pt urine culture neg pt c/o a weird sensation with urination offered pt an appt for 10/15 for eval pt declined appt will keep appt for 10/17 and have eval then

## 2012-06-18 NOTE — Telephone Encounter (Signed)
Lm on vm tcb rgd msg 

## 2012-06-20 ENCOUNTER — Other Ambulatory Visit: Payer: Medicaid Other | Admitting: Lab

## 2012-06-21 ENCOUNTER — Ambulatory Visit (INDEPENDENT_AMBULATORY_CARE_PROVIDER_SITE_OTHER): Payer: Medicaid Other

## 2012-06-21 ENCOUNTER — Encounter: Payer: Self-pay | Admitting: Obstetrics and Gynecology

## 2012-06-21 ENCOUNTER — Other Ambulatory Visit: Payer: Self-pay | Admitting: Obstetrics and Gynecology

## 2012-06-21 ENCOUNTER — Ambulatory Visit (INDEPENDENT_AMBULATORY_CARE_PROVIDER_SITE_OTHER): Payer: Medicaid Other | Admitting: Obstetrics and Gynecology

## 2012-06-21 VITALS — BP 128/80 | Wt 234.0 lb

## 2012-06-21 DIAGNOSIS — O4410 Placenta previa with hemorrhage, unspecified trimester: Secondary | ICD-10-CM

## 2012-06-21 DIAGNOSIS — IMO0002 Reserved for concepts with insufficient information to code with codable children: Secondary | ICD-10-CM

## 2012-06-21 DIAGNOSIS — O4420 Partial placenta previa NOS or without hemorrhage, unspecified trimester: Secondary | ICD-10-CM

## 2012-06-21 DIAGNOSIS — O365990 Maternal care for other known or suspected poor fetal growth, unspecified trimester, not applicable or unspecified: Secondary | ICD-10-CM | POA: Insufficient documentation

## 2012-06-21 DIAGNOSIS — Z331 Pregnant state, incidental: Secondary | ICD-10-CM

## 2012-06-21 LAB — US OB TRANSVAGINAL

## 2012-06-21 LAB — US OB FOLLOW UP

## 2012-06-21 NOTE — Progress Notes (Signed)
Pt declines flu shot today. Pt is not having any discharge but thought she had a yeast infection. Pt has been taking otc meds that seem to help but would like to be checked today to see if she has yeast infection.

## 2012-06-21 NOTE — Progress Notes (Signed)
[redacted]w[redacted]d F/u anatomy scan EFW 7.3% cx 5.08 cm Complete posterior previa, WNL fluid vertical pocket 3.0 cm LVOT, RVOT, Ductal arch unseen due to maternal body habitus Placental cord insertion not seen due to probable velamentous cord insertion Complete previa Vasa previa Growth 7th%tile f/u 2 wk suggested Lengthy heart decel seen 55 BMP but recovered to NL quickly  MFM consult suggested to eval. vasa previa BPP 8/8

## 2012-06-21 NOTE — Progress Notes (Signed)
Patient ID: [name], female   DOB: 11-19-1980, 31 y.o.   MRN: [id number] [redacted]w[redacted]d [place] results discussed with Dr. Stefano Gaul plan weekly BPP, f/o 1 gtt next visit, going to MAU for iron tomorrow, importance dicussed, reviewed [place] risks of IUGR, hemorrhage with placenta previa and vaso previa. Reviewed s/s preterm labor, srom, vag bleeding,daily kick counts to report, encouraged 8 water daily and frequent voids. Lavera Guise, CNM  ,

## 2012-06-22 ENCOUNTER — Telehealth: Payer: Self-pay | Admitting: Obstetrics and Gynecology

## 2012-06-22 NOTE — Telephone Encounter (Signed)
Pt called and states that she went to MAU today and was to get an iron treatment but the hospital did not see an order for the treatment, so pt left the hospital.  Spoke w/ [employer] regarding pt concerns, pt says pt can come back MAU, order is in the system, if she cannot go back today, she can go back tomorrow.  Tc to pt to make aware, lm on vm regarding msg.

## 2012-06-22 NOTE — Telephone Encounter (Signed)
Come to MAU opening now to get iron treatment. Lavera Guise, CNM

## 2012-06-27 ENCOUNTER — Other Ambulatory Visit: Payer: Medicaid Other | Admitting: Lab

## 2012-06-27 ENCOUNTER — Other Ambulatory Visit: Payer: Medicaid Other

## 2012-06-28 ENCOUNTER — Ambulatory Visit (INDEPENDENT_AMBULATORY_CARE_PROVIDER_SITE_OTHER): Payer: Medicaid Other

## 2012-06-28 ENCOUNTER — Ambulatory Visit (INDEPENDENT_AMBULATORY_CARE_PROVIDER_SITE_OTHER): Payer: Medicaid Other | Admitting: Obstetrics and Gynecology

## 2012-06-28 ENCOUNTER — Other Ambulatory Visit: Payer: Medicaid Other

## 2012-06-28 ENCOUNTER — Encounter: Payer: Medicaid Other | Admitting: Obstetrics and Gynecology

## 2012-06-28 ENCOUNTER — Telehealth: Payer: Self-pay | Admitting: Obstetrics and Gynecology

## 2012-06-28 DIAGNOSIS — O4420 Partial placenta previa NOS or without hemorrhage, unspecified trimester: Secondary | ICD-10-CM

## 2012-06-28 DIAGNOSIS — O4410 Placenta previa with hemorrhage, unspecified trimester: Secondary | ICD-10-CM

## 2012-06-28 NOTE — Progress Notes (Signed)
[redacted]w[redacted]d The patient reports that she has been very stressed.  She has a history of the same as well as postpartum depression.  She is currently on Zoloft 100 mg each day.  She feels that her symptoms are getting worse.  She declined counseling.  We will increase her Zoloft to 150 mg daily. The patient has a history of a small for gestational age infant, placenta previa, and possible vasa previa.  She does not report bleeding. Biophysical profile: 8 out of 8.  Normal fluid.  Single gestation with a vertex presentation.  Cervix is closed. Return office in one week for repeat biophysical profile and growth ultrasound. Glucola next visit. Stress and depression discussed.  The patient was told to call if her symptoms worsen. Dr. Stefano Gaul

## 2012-06-28 NOTE — Progress Notes (Signed)
[redacted]w[redacted]d  Pt wants to do 1 GTT @ 28weeks she did not make 06-27-12 LAB appt for GTT.   U/S: BPP S<D  Vertex presentation. Posterior placenta. Normal fluid: AFI = 50th%tile.  Score 8 of 8 in 21 minutes.  CX closed.   Pt concerned about weight gain and growth of baby, seems upset/frustrated  stated "she cant do this".

## 2012-06-28 NOTE — Telephone Encounter (Signed)
Tc to pt regarding msg.  Pt states needed to cancel appt this am because her 33 YO son is sick w/ a fever and day care would not let him go to school this am, wants an appt later this afternoon or tomorrow when she has a babysitter.  Pt rescheduled appt for today @ 1330 for [place] and ROB/f/u after w/ AVS, pt voices agreement.

## 2012-06-30 ENCOUNTER — Other Ambulatory Visit: Payer: Self-pay | Admitting: Obstetrics and Gynecology

## 2012-06-30 ENCOUNTER — Telehealth: Payer: Self-pay | Admitting: Obstetrics and Gynecology

## 2012-06-30 NOTE — Telephone Encounter (Signed)
TC from patient--27 weeks, with sporadic cramping yesterday, more constant today.  More pelvic pressure. Denies bleeding or leaking, reports +FM. G5P2, with 2 previous C/S, plans repeat--hx 1 SVB with rapid labor. Risk fx:  Vasa previa, IUGR vs. SGA, hx LGA infant, severe anemia, gestational diabetes, depression/hx pp depression--on Zoloft 150 mg po daily.  Instructed patient to come to MAU for evaluation.

## 2012-07-03 ENCOUNTER — Encounter: Payer: Medicaid Other | Admitting: Obstetrics and Gynecology

## 2012-07-03 ENCOUNTER — Other Ambulatory Visit: Payer: Medicaid Other

## 2012-07-04 ENCOUNTER — Telehealth: Payer: Self-pay | Admitting: Obstetrics and Gynecology

## 2012-07-04 ENCOUNTER — Other Ambulatory Visit: Payer: Medicaid Other

## 2012-07-04 ENCOUNTER — Encounter: Payer: Medicaid Other | Admitting: Obstetrics and Gynecology

## 2012-07-04 DIAGNOSIS — Z331 Pregnant state, incidental: Secondary | ICD-10-CM

## 2012-07-04 MED ORDER — SERTRALINE HCL 100 MG PO TABS: 150.0000 mg | ORAL_TABLET | Freq: Every day | ORAL | Status: DC

## 2012-07-04 NOTE — Telephone Encounter (Signed)
Spoke with pt rgs msg. Approved rx refill request . Pt stated she had ran out of rx. zoloft 150 mg 1.5 sig one po qd disp 30 with 0 refills. Pt will get a new rx when she comes in. Pt's voice understanding.

## 2012-07-12 ENCOUNTER — Other Ambulatory Visit: Payer: Medicaid Other | Admitting: Lab

## 2012-07-13 ENCOUNTER — Ambulatory Visit (INDEPENDENT_AMBULATORY_CARE_PROVIDER_SITE_OTHER): Payer: Medicaid Other | Admitting: Obstetrics and Gynecology

## 2012-07-13 ENCOUNTER — Ambulatory Visit (INDEPENDENT_AMBULATORY_CARE_PROVIDER_SITE_OTHER): Payer: Medicaid Other

## 2012-07-13 DIAGNOSIS — O365990 Maternal care for other known or suspected poor fetal growth, unspecified trimester, not applicable or unspecified: Secondary | ICD-10-CM

## 2012-07-13 DIAGNOSIS — IMO0002 Reserved for concepts with insufficient information to code with codable children: Secondary | ICD-10-CM

## 2012-07-13 NOTE — Progress Notes (Signed)
[redacted]w[redacted]d Pt arrived 45 minutes late for ultrasound appointment and was worked back in the schedule. Refused to wait to see MD and review results and plan of care. Pregnancy complicated by IUGR, complete previa and severe anemia. Pt was referred to hematologist for management of severe anemia and did not comply: [facility] or cancelled  Pt to be transferred to [facility] for non-compliance

## 2012-07-13 NOTE — Progress Notes (Signed)
[redacted]w[redacted]d  Ultrasound shows:  SIUP  S<D     [place] EDD: 09/28/2012           AFI: 13.94 cm           Cervical length: 4.07 cm           Placenta localization: posterior            Fetal presentation: breech frank Comments: Frank breech presentation, complete posterior placenta previa. AFI is normal (45th%) BPP =8/8, umbilical artery dopplers are normal (no absent or reversed flow is seen) S/D ratio ABD RI = 50th% Complete placental previa, no vasa previa is seen, normal ovaries, normal adnexa.

## 2012-07-16 ENCOUNTER — Other Ambulatory Visit: Payer: Self-pay | Admitting: Obstetrics and Gynecology

## 2012-07-16 ENCOUNTER — Telehealth: Payer: Self-pay | Admitting: Obstetrics and Gynecology

## 2012-07-16 DIAGNOSIS — Z9119 Patient's noncompliance with other medical treatment and regimen: Secondary | ICD-10-CM

## 2012-07-16 NOTE — Telephone Encounter (Signed)
Tc to [facility] for referral per SR.  Per receptionist, we must put a referral into the system to the clinics, they will review and give the patient a call for an appt.  Referral completed in Epic system.

## 2012-07-24 ENCOUNTER — Telehealth: Payer: Self-pay | Admitting: Obstetrics and Gynecology

## 2012-07-25 ENCOUNTER — Telehealth: Payer: Self-pay | Admitting: Obstetrics and Gynecology

## 2012-07-25 NOTE — Telephone Encounter (Signed)
TC TO PT. LEFT VM  INFORMING PT THAT SHE IS BEING REFERRED TO A HIGH RISK CLINIC AND SOMEONE FROM THAT CLINIC WILL GIVE HER A CALL FOR AN APPT.

## 2012-07-26 ENCOUNTER — Other Ambulatory Visit: Payer: Medicaid Other

## 2012-07-26 ENCOUNTER — Telehealth: Payer: Self-pay | Admitting: Obstetrics and Gynecology

## 2012-07-26 NOTE — Telephone Encounter (Signed)
Pt called, per SR scheduled pt [place] for BPP, growth and dopplers for 07/27/12 @ 1530.  Discussed w/ AVS seeing pt after pt's [place] b/c he is on call that day.  Per AVS call him when pt comes in for appt for f/u visit.  Pt voices agreement to all.

## 2012-07-27 ENCOUNTER — Other Ambulatory Visit: Payer: Self-pay | Admitting: Obstetrics and Gynecology

## 2012-07-27 ENCOUNTER — Ambulatory Visit (INDEPENDENT_AMBULATORY_CARE_PROVIDER_SITE_OTHER): Payer: Medicaid Other | Admitting: Obstetrics and Gynecology

## 2012-07-27 ENCOUNTER — Ambulatory Visit (INDEPENDENT_AMBULATORY_CARE_PROVIDER_SITE_OTHER): Payer: Medicaid Other

## 2012-07-27 VITALS — BP 124/80 | Wt 235.0 lb

## 2012-07-27 DIAGNOSIS — O4400 Placenta previa specified as without hemorrhage, unspecified trimester: Secondary | ICD-10-CM

## 2012-07-27 DIAGNOSIS — IMO0002 Reserved for concepts with insufficient information to code with codable children: Secondary | ICD-10-CM

## 2012-07-27 DIAGNOSIS — D649 Anemia, unspecified: Secondary | ICD-10-CM

## 2012-07-27 DIAGNOSIS — O365990 Maternal care for other known or suspected poor fetal growth, unspecified trimester, not applicable or unspecified: Secondary | ICD-10-CM

## 2012-07-27 DIAGNOSIS — O4410 Placenta previa with hemorrhage, unspecified trimester: Secondary | ICD-10-CM

## 2012-07-27 DIAGNOSIS — Z331 Pregnant state, incidental: Secondary | ICD-10-CM

## 2012-07-27 DIAGNOSIS — O99019 Anemia complicating pregnancy, unspecified trimester: Secondary | ICD-10-CM

## 2012-07-27 LAB — US OB FOLLOW UP

## 2012-07-27 LAB — US OB TRANSVAGINAL

## 2012-07-27 NOTE — Progress Notes (Signed)
[redacted]w[redacted]d Ultrasound: Single gestation, vertex, normal fluid, 3 lbs. 3 oz. (24th percentile), cervix 4.42 cm, BPP 8 out of 8, complete placenta previa. The patient will follow up her care in the high-risk clinic. Dr. Stefano Gaul

## 2012-07-27 NOTE — Progress Notes (Signed)
[redacted]w[redacted]d  Pt has no concerns today. F/u after U/S.

## 2012-08-06 ENCOUNTER — Ambulatory Visit: Payer: Medicaid Other | Admitting: Oncology

## 2012-08-06 ENCOUNTER — Other Ambulatory Visit: Payer: Medicaid Other | Admitting: Lab

## 2012-08-07 ENCOUNTER — Telehealth: Payer: Self-pay | Admitting: Oncology

## 2012-08-07 ENCOUNTER — Ambulatory Visit (INDEPENDENT_AMBULATORY_CARE_PROVIDER_SITE_OTHER): Payer: Medicaid Other | Admitting: Obstetrics and Gynecology

## 2012-08-07 ENCOUNTER — Other Ambulatory Visit: Payer: Self-pay | Admitting: Oncology

## 2012-08-07 ENCOUNTER — Encounter: Payer: Self-pay | Admitting: Obstetrics and Gynecology

## 2012-08-07 VITALS — BP 132/90 | Temp 98.0°F | Wt 235.4 lb

## 2012-08-07 DIAGNOSIS — O4410 Placenta previa with hemorrhage, unspecified trimester: Secondary | ICD-10-CM

## 2012-08-07 DIAGNOSIS — Z8659 Personal history of other mental and behavioral disorders: Secondary | ICD-10-CM

## 2012-08-07 DIAGNOSIS — O34219 Maternal care for unspecified type scar from previous cesarean delivery: Secondary | ICD-10-CM

## 2012-08-07 DIAGNOSIS — IMO0002 Reserved for concepts with insufficient information to code with codable children: Secondary | ICD-10-CM

## 2012-08-07 DIAGNOSIS — O0990 Supervision of high risk pregnancy, unspecified, unspecified trimester: Secondary | ICD-10-CM

## 2012-08-07 DIAGNOSIS — O4400 Placenta previa specified as without hemorrhage, unspecified trimester: Secondary | ICD-10-CM

## 2012-08-07 DIAGNOSIS — O09299 Supervision of pregnancy with other poor reproductive or obstetric history, unspecified trimester: Secondary | ICD-10-CM

## 2012-08-07 DIAGNOSIS — Z98891 History of uterine scar from previous surgery: Secondary | ICD-10-CM

## 2012-08-07 DIAGNOSIS — Z331 Pregnant state, incidental: Secondary | ICD-10-CM

## 2012-08-07 LAB — POCT URINALYSIS DIP (DEVICE)
Glucose, UA: NEGATIVE mg/dL
Ketones, ur: NEGATIVE mg/dL
Specific Gravity, Urine: 1.02 (ref 1.005–1.030)

## 2012-08-07 MED ORDER — SERTRALINE HCL 100 MG PO TABS: 150.0000 mg | ORAL_TABLET | Freq: Every day | ORAL | Status: DC

## 2012-08-07 NOTE — Progress Notes (Signed)
Patient transferred to [facility] clinic from [facility] secondary to non-compliance with management care. Patient did not come for 1hr GCT, did not go to hematology referral for anemia and came late to appointments.  Patient did not understands the meaning of a placenta previa. Definition along with risks of hemorrhage, need for blood transfusion, potential need for hysterectomy at time of c-section. Patient verbalized understanding and all questions were answered. Patient did not realize how serious this diagnosis was. Patient reports being seen by hematologist and iron transfusion was ordered through MAU but she never received it. Advised patient to follow-up with hematologist and continue taking prenatal vitamins. Patient was made aware that she may need a blood transfusion prior to delivery secondary to anticipated blood loss, if her anemia is not improved. Will schedule MFM ultrasound for growth and placenta FM/PTL/bleeding precautions reviewed Patient with previous lap band procedure and could not tolerate 1hr GCT- patient will return this week for jelly bean test, labs and vaccines Patient interested in Mirena IUD but her husband is a strict Muslim who doesn't believe in birth control. Now that she understands her current pregnancy complications, she does not desire any future pregnancies but is still trying to be respectful of her religion.

## 2012-08-07 NOTE — Telephone Encounter (Signed)
s.w. pt and rescheduled missed appt.Marland KitchenMarland KitchenMarland KitchenDone.Marland KitchenMarland KitchenMarland Kitchen

## 2012-08-07 NOTE — Progress Notes (Signed)
Pulse: 85 Pt has been having very bad headaches for the past week. Pt vomited glucola.

## 2012-08-17 ENCOUNTER — Other Ambulatory Visit: Payer: Medicaid Other

## 2012-08-17 ENCOUNTER — Other Ambulatory Visit: Payer: Self-pay | Admitting: Obstetrics and Gynecology

## 2012-08-17 ENCOUNTER — Ambulatory Visit (HOSPITAL_COMMUNITY)
Admission: RE | Admit: 2012-08-17 | Discharge: 2012-08-17 | Disposition: A | Discharge status: Home / Self Care | Payer: Medicaid Other | Source: Ambulatory Visit | Attending: Obstetrics and Gynecology | Admitting: Obstetrics and Gynecology

## 2012-08-17 DIAGNOSIS — IMO0002 Reserved for concepts with insufficient information to code with codable children: Secondary | ICD-10-CM

## 2012-08-17 DIAGNOSIS — O365990 Maternal care for other known or suspected poor fetal growth, unspecified trimester, not applicable or unspecified: Secondary | ICD-10-CM | POA: Insufficient documentation

## 2012-08-17 DIAGNOSIS — O337XX0 Maternal care for disproportion due to other fetal deformities, not applicable or unspecified: Secondary | ICD-10-CM | POA: Insufficient documentation

## 2012-08-17 DIAGNOSIS — O09299 Supervision of pregnancy with other poor reproductive or obstetric history, unspecified trimester: Secondary | ICD-10-CM | POA: Insufficient documentation

## 2012-08-17 DIAGNOSIS — O4400 Placenta previa specified as without hemorrhage, unspecified trimester: Secondary | ICD-10-CM

## 2012-08-17 DIAGNOSIS — Z1389 Encounter for screening for other disorder: Secondary | ICD-10-CM | POA: Insufficient documentation

## 2012-08-17 DIAGNOSIS — O358XX0 Maternal care for other (suspected) fetal abnormality and damage, not applicable or unspecified: Secondary | ICD-10-CM | POA: Insufficient documentation

## 2012-08-17 DIAGNOSIS — O34219 Maternal care for unspecified type scar from previous cesarean delivery: Secondary | ICD-10-CM | POA: Insufficient documentation

## 2012-08-17 DIAGNOSIS — O0990 Supervision of high risk pregnancy, unspecified, unspecified trimester: Secondary | ICD-10-CM

## 2012-08-17 DIAGNOSIS — Z8751 Personal history of pre-term labor: Secondary | ICD-10-CM | POA: Insufficient documentation

## 2012-08-17 DIAGNOSIS — Z363 Encounter for antenatal screening for malformations: Secondary | ICD-10-CM | POA: Insufficient documentation

## 2012-08-17 DIAGNOSIS — O352XX0 Maternal care for (suspected) hereditary disease in fetus, not applicable or unspecified: Secondary | ICD-10-CM | POA: Insufficient documentation

## 2012-08-17 NOTE — Progress Notes (Signed)
[name] was seen for ultrasound appointment today.  Please see AS-OBGYN report for details.  Comments The patient's fetal anatomic survey is not complete due to advanced gestational age.  However, no gross fetal anomalies were identified.  Transvaginal ultrasound demonstrates a posterior marginal placenta previa.  No vasa previa is seen.  Impression Single living intrauterine pregnancy at 34 weeks 3 days. Posterior placenta previa. Fetal growth restriction (EFW <10%) Normal amniotic fluid volume. No gross fetal anomalies identified.  BPP 8/8. Normal umbilical artery dopplers.  Recommendations Given the combination of fetal growth restriction and placenta previa I recommend [name] have a repeat LTCS between 36 and 37 weeks of gestation.  I also recommend twice weekly fetal testing until delivery.

## 2012-08-20 ENCOUNTER — Encounter: Payer: Self-pay | Admitting: Obstetrics and Gynecology

## 2012-08-21 ENCOUNTER — Ambulatory Visit: Payer: Medicaid Other | Admitting: Oncology

## 2012-08-21 ENCOUNTER — Other Ambulatory Visit: Payer: Medicaid Other | Admitting: Lab

## 2012-08-23 ENCOUNTER — Other Ambulatory Visit: Payer: Medicaid Other | Admitting: Lab

## 2012-08-23 ENCOUNTER — Ambulatory Visit (INDEPENDENT_AMBULATORY_CARE_PROVIDER_SITE_OTHER): Payer: Medicaid Other | Admitting: Family

## 2012-08-23 ENCOUNTER — Ambulatory Visit: Payer: Medicaid Other | Admitting: Oncology

## 2012-08-23 VITALS — BP 143/93 | Temp 98.6°F | Wt 238.3 lb

## 2012-08-23 DIAGNOSIS — O0990 Supervision of high risk pregnancy, unspecified, unspecified trimester: Secondary | ICD-10-CM

## 2012-08-23 DIAGNOSIS — O09299 Supervision of pregnancy with other poor reproductive or obstetric history, unspecified trimester: Secondary | ICD-10-CM

## 2012-08-23 DIAGNOSIS — D649 Anemia, unspecified: Secondary | ICD-10-CM

## 2012-08-23 DIAGNOSIS — O4410 Placenta previa with hemorrhage, unspecified trimester: Secondary | ICD-10-CM

## 2012-08-23 DIAGNOSIS — IMO0002 Reserved for concepts with insufficient information to code with codable children: Secondary | ICD-10-CM

## 2012-08-23 DIAGNOSIS — Z8632 Personal history of gestational diabetes: Secondary | ICD-10-CM

## 2012-08-23 LAB — CBC
MCH: 14.8 pg — ABNORMAL LOW (ref 26.0–34.0)
MCV: 83.7 fL (ref 78.0–100.0)
Platelets: 373 10*3/uL (ref 150–400)
RDW: 22.6 % — ABNORMAL HIGH (ref 11.5–15.5)

## 2012-08-23 LAB — POCT URINALYSIS DIP (DEVICE)
Hgb urine dipstick: NEGATIVE
Protein, ur: NEGATIVE mg/dL
Specific Gravity, Urine: 1.025 (ref 1.005–1.030)
Urobilinogen, UA: 1 mg/dL (ref 0.0–1.0)

## 2012-08-23 MED ORDER — INTEGRA F 125-1 MG PO CAPS: 1.0000 | ORAL_CAPSULE | Freq: Every day | ORAL | Status: AC

## 2012-08-23 NOTE — Progress Notes (Signed)
NST - reactive; AFI wnl; Pt vomited jelly beans today, unable to hold down 1 hr at a previous visit; Random BS 100; order Integra, schedule section for next Friday (message sent to [place].  Obtain CBC today, will transfuse with two units of blood night before procedure (waiting for OR time).

## 2012-08-23 NOTE — Progress Notes (Signed)
P=79 

## 2012-08-23 NOTE — Progress Notes (Signed)
Abnormal results low Hb 6.6 from today reported

## 2012-08-24 ENCOUNTER — Encounter (HOSPITAL_COMMUNITY): Payer: Self-pay | Admitting: Pharmacist

## 2012-08-24 ENCOUNTER — Encounter: Payer: Self-pay | Admitting: Oncology

## 2012-08-24 ENCOUNTER — Ambulatory Visit: Payer: Medicaid Other | Admitting: Oncology

## 2012-08-24 ENCOUNTER — Other Ambulatory Visit: Payer: Medicaid Other | Admitting: Lab

## 2012-08-27 ENCOUNTER — Ambulatory Visit (INDEPENDENT_AMBULATORY_CARE_PROVIDER_SITE_OTHER): Payer: Medicaid Other | Admitting: *Deleted

## 2012-08-27 VITALS — BP 118/66 | Wt 237.5 lb

## 2012-08-27 DIAGNOSIS — O365990 Maternal care for other known or suspected poor fetal growth, unspecified trimester, not applicable or unspecified: Secondary | ICD-10-CM

## 2012-08-27 LAB — GLUCOSE, CAPILLARY

## 2012-08-27 NOTE — Progress Notes (Signed)
P = 89  Pt reports decr. FM today- felt good FM during NST.    Pt is scheduled for C/S on 08/31/12

## 2012-08-30 ENCOUNTER — Telehealth: Payer: Self-pay | Admitting: Obstetrics & Gynecology

## 2012-08-30 ENCOUNTER — Inpatient Hospital Stay (HOSPITAL_COMMUNITY)
Admission: AD | Admit: 2012-08-30 | Discharge: 2012-09-02 | DRG: 765 | Disposition: A | Discharge status: Home / Self Care | Payer: Medicaid Other | Source: Ambulatory Visit | Attending: Obstetrics & Gynecology | Admitting: Obstetrics & Gynecology

## 2012-08-30 ENCOUNTER — Encounter (HOSPITAL_COMMUNITY): Payer: Self-pay

## 2012-08-30 ENCOUNTER — Encounter (HOSPITAL_COMMUNITY)
Admission: RE | Admit: 2012-08-30 | Discharge: 2012-08-30 | Disposition: A | Discharge status: Home / Self Care | Payer: Medicaid Other | Source: Ambulatory Visit | Attending: Obstetrics & Gynecology | Admitting: Obstetrics & Gynecology

## 2012-08-30 ENCOUNTER — Encounter (HOSPITAL_COMMUNITY): Payer: Self-pay | Admitting: Obstetrics

## 2012-08-30 VITALS — BP 130/83 | Ht 65.0 in | Wt 230.0 lb

## 2012-08-30 DIAGNOSIS — O0990 Supervision of high risk pregnancy, unspecified, unspecified trimester: Secondary | ICD-10-CM

## 2012-08-30 DIAGNOSIS — B3789 Other sites of candidiasis: Secondary | ICD-10-CM | POA: Diagnosis present

## 2012-08-30 DIAGNOSIS — O9882 Other maternal infectious and parasitic diseases complicating childbirth: Secondary | ICD-10-CM | POA: Diagnosis present

## 2012-08-30 DIAGNOSIS — O9902 Anemia complicating childbirth: Secondary | ICD-10-CM | POA: Diagnosis present

## 2012-08-30 DIAGNOSIS — O34219 Maternal care for unspecified type scar from previous cesarean delivery: Secondary | ICD-10-CM | POA: Diagnosis present

## 2012-08-30 DIAGNOSIS — O365990 Maternal care for other known or suspected poor fetal growth, unspecified trimester, not applicable or unspecified: Secondary | ICD-10-CM | POA: Diagnosis present

## 2012-08-30 DIAGNOSIS — Z331 Pregnant state, incidental: Secondary | ICD-10-CM

## 2012-08-30 DIAGNOSIS — D649 Anemia, unspecified: Secondary | ICD-10-CM | POA: Diagnosis present

## 2012-08-30 DIAGNOSIS — O4410 Placenta previa with hemorrhage, unspecified trimester: Principal | ICD-10-CM | POA: Diagnosis present

## 2012-08-30 LAB — CBC
HCT: 25.5 % — ABNORMAL LOW (ref 36.0–46.0)
Hemoglobin: 6.8 g/dL — CL (ref 12.0–15.0)
MCH: 14.8 pg — ABNORMAL LOW (ref 26.0–34.0)
MCHC: 26.7 g/dL — ABNORMAL LOW (ref 30.0–36.0)
MCV: 55.3 fL — ABNORMAL LOW (ref 78.0–100.0)

## 2012-08-30 MED ORDER — ACETAMINOPHEN 325 MG PO TABS
650.0000 mg | ORAL_TABLET | Freq: Once | ORAL | Status: AC
  Administered 2012-08-31: 650 mg via ORAL
  Filled 2012-08-30: qty 2

## 2012-08-30 MED ORDER — SODIUM CHLORIDE 0.9 % IV SOLN
INTRAVENOUS | Status: DC
  Administered 2012-08-31: via INTRAVENOUS

## 2012-08-30 MED ORDER — SERTRALINE HCL 50 MG PO TABS
150.0000 mg | ORAL_TABLET | Freq: Every day | ORAL | Status: DC
  Filled 2012-08-30 (×2): qty 1

## 2012-08-30 MED ORDER — ACETAMINOPHEN 325 MG PO TABS: 650.0000 mg | ORAL_TABLET | ORAL | Status: DC | PRN

## 2012-08-30 MED ORDER — DIPHENHYDRAMINE HCL 25 MG PO CAPS
25.0000 mg | ORAL_CAPSULE | Freq: Once | ORAL | Status: AC
  Administered 2012-08-31: 25 mg via ORAL
  Filled 2012-08-30: qty 1

## 2012-08-30 MED ORDER — CALCIUM CARBONATE ANTACID 500 MG PO CHEW
2.0000 | CHEWABLE_TABLET | ORAL | Status: DC | PRN
  Administered 2012-08-31: 400 mg via ORAL
  Filled 2012-08-30: qty 2

## 2012-08-30 NOTE — Patient Instructions (Addendum)
20 [name]  08/30/2012   Your procedure is scheduled on:  08/31/12  Enter through the Main Entrance of [facility] at 8 AM.  Pick up the phone at the desk and dial [phone].   Call this number if you have problems the morning of surgery: [phone]   Remember:   Do not eat food:After Midnight.  Do not drink clear liquids: After Midnight.  Take these medicines the morning of surgery with A SIP OF WATER: Zoloft, acid medication   Do not wear jewelry, make-up or nail polish.  Do not wear lotions, powders, or perfumes. You may wear deodorant.  Do not shave 48 hours prior to surgery.  Do not bring valuables to the hospital.  Contacts, dentures or bridgework may not be worn into surgery.  Leave suitcase in the car. After surgery it may be brought to your room.  For patients admitted to the hospital, checkout time is 11:00 AM the day of discharge.   Patients discharged the day of surgery will not be allowed to drive home.  Name and phone number of your driver: NA  Special Instructions: Shower using CHG 2 nights before surgery and the night before surgery.  If you shower the day of surgery use CHG.  Use special wash - you have one bottle of CHG for all showers.  You should use approximately 1/3 of the bottle for each shower.   Please read over the following fact sheets that you were given: MRSA Information

## 2012-08-30 NOTE — Pre-Procedure Instructions (Signed)
Spoke with patient,  instructions for 16:00 admission today for transfusion  given.

## 2012-08-30 NOTE — Pre-Procedure Instructions (Signed)
Hgb of 6.8 called to Dr. Penne Lash,  She stated she has made arrangements for pt to come in at [facility] today for transfusion prior to surg tomorrow. She is attempting to get in touch with patient to instruct her and asked that I do the same. Message left on patient voicemail.

## 2012-08-30 NOTE — Telephone Encounter (Signed)
I spoke to the patient via telephone.  She said that she will not be here by 4pm but, will arrange for childcare and come as soon as she showers for her blood transfusion in preparation for the OR in the am. Eber Davee L. [name], M.D., Evern Core

## 2012-08-30 NOTE — H&P (Signed)
[name] is a 31 y.o. female presenting for Transfusion prior to C/S.  Maternal Medical History:  Reason for admission: Reason for Admission:   nauseaContractions: Frequency: rare.   Perceived severity is mild.    Fetal activity: Perceived fetal activity is normal.   Last perceived fetal movement was within the past hour.    Prenatal complications: Placental abnormality (previa).   No bleeding.     OB History    Grav Para Term Preterm Abortions TAB SAB Ect Mult Living   5 3 2 1 1  1   3      Obstetric Comments   PP DEPRESSION; NO MEDS     Past Medical History  Diagnosis Date  . Pregnant   . H/O varicella   . Yeast infection   . Bacterial infection   . Chlamydia infection   . BV (bacterial vaginosis) 2005  . Pelvic pain in female 2005  . Yeast vaginitis 2011  . Complication of anesthesia 2007    PROLONGED NUMBNESS WITH EPIDURAl  . Blood transfusion 2007    AFTER DELIVERY  . Blood transfusion 2012    AFTER DELIVERY  . Depression 2012    PP  . Anxiety     AS TEEN AFTER LOSS OF GM  . Preterm labor 2004  . Anemia     LONG HISTORY; IV iron 2009  . History of gestational diabetes in prior pregnancy, currently pregnant 03/21/2012  . History of preterm delivery, currently pregnant 03/21/2012    G1=36 weeker  . H/O precipitous labor and deliveries, antepartum 03/21/2012    1hr labors w/ last 2 deliveries  . History of postpartum depression, currently pregnant 03/21/2012  . Uterine fibroid   . Unspecified deficiency anemia 05/11/2012  . Blood transfusion, without reported diagnosis 2007; and 2012    with previous pregnancies.   . Placenta previa   . Gestational diabetes 2007  . Pregnancy induced hypertension 2007   Past Surgical History  Procedure Date  . Cesarean section     x2 (2007; 2012)  . Dilation and curettage of uterus 2005    SAB  . Laparoscopic gastric banding 2010  . Wisdom tooth extraction 2007;2012   Family History: family history includes Arthritis  in her maternal grandmother; Depression in her maternal grandmother; Diabetes in her father; Heart disease in her maternal grandfather; Hyperlipidemia in her maternal grandmother; Hypertension in her maternal aunt and maternal grandmother; Kidney disease in her maternal aunt, maternal grandfather, maternal uncle, and mother; Parkinsonism in her mother; Pulmonary embolism in her maternal grandfather; Rheum arthritis in her maternal grandmother; Seizures in her sister; Sickle cell trait in her maternal grandfather; and Vision loss in her maternal grandmother. Social History:  reports that she quit smoking about 6 months ago. Her smoking use included Cigarettes. She has never used smokeless tobacco. She reports that she does not drink alcohol or use illicit drugs.   Prenatal Transfer Tool  Maternal Diabetes: No Genetic Screening: Normal Maternal Ultrasounds/Referrals: Normal Fetal Ultrasounds or other Referrals:  Other:  Placenta Previa Maternal Substance Abuse:  No Significant Maternal Medications:  None Significant Maternal Lab Results:  Lab values include: Other:  Other Comments:  None  Review of Systems  Constitutional: Negative for fever.  Gastrointestinal: Negative for nausea, vomiting and abdominal pain.      Last menstrual period 12/20/2011, unknown if currently breastfeeding. Maternal Exam:  Uterine Assessment: Contraction strength is mild.  Contraction frequency is rare.   Abdomen: Surgical scars: low transverse.  Fundal height is 37.   Estimated fetal weight is 7.5.    Introitus: Ferning test: not done.  Nitrazine test: not done. Amniotic fluid character: not assessed.     Fetal Exam Fetal Monitor Review: Mode: ultrasound.   Baseline rate: 125.  Variability: moderate (6-25 bpm).   Pattern: accelerations present and no decelerations.    Fetal State Assessment: Category I - tracings are normal.     Physical Exam  Constitutional: She is oriented to person, place,  and time. She appears well-developed and well-nourished. No distress.  HENT:  Head: Normocephalic.  Cardiovascular: Normal rate.   Respiratory: Effort normal.  GI: Soft. She exhibits no distension. There is no tenderness.  Musculoskeletal: Normal range of motion.  Neurological: She is alert and oriented to person, place, and time.  Skin: Skin is warm and dry.  Psychiatric: She has a normal mood and affect.    Results for orders placed during the hospital encounter of 08/30/12 (from the past 24 hour(s))  PREPARE RBC (CROSSMATCH)     Status: Normal   Collection Time   08/30/12 10:38 PM      Component Value Range   Order Confirmation ORDER PROCESSED BY BLOOD BANK     Hemoglobin 6.8   Prenatal labs: ABO, Rh: --/--/A POS (12/26 1125) Antibody: NEG (12/26 1125) Rubella: 103.5 (07/16 1103) RPR: NON REACTIVE (12/26 1125)  HBsAg: NEGATIVE (07/16 1103)  HIV: NON REACTIVE (07/16 1103)  GBS:     Assessment/Plan: A:  SIUP at [redacted]w[redacted]d       Placenta Previa      Previous C/S      Anemia  P:  Admit to Birthing Suites      Transfuse 2 units per Dr Erin Fulling       NST       C/S in AM  [facility] 08/30/2012, 11:35 PM

## 2012-08-31 ENCOUNTER — Inpatient Hospital Stay (HOSPITAL_COMMUNITY)
Admission: RE | Admit: 2012-08-31 | Payer: Medicaid Other | Source: Ambulatory Visit | Admitting: Obstetrics & Gynecology

## 2012-08-31 ENCOUNTER — Encounter (HOSPITAL_COMMUNITY): Payer: Self-pay | Admitting: Anesthesiology

## 2012-08-31 ENCOUNTER — Encounter (HOSPITAL_COMMUNITY): Payer: Self-pay | Admitting: *Deleted

## 2012-08-31 ENCOUNTER — Encounter (HOSPITAL_COMMUNITY)
Admission: AD | Disposition: A | Discharge status: Home / Self Care | Payer: Self-pay | Source: Ambulatory Visit | Attending: Obstetrics & Gynecology

## 2012-08-31 ENCOUNTER — Inpatient Hospital Stay (HOSPITAL_COMMUNITY): Payer: Medicaid Other | Admitting: Anesthesiology

## 2012-08-31 DIAGNOSIS — O4410 Placenta previa with hemorrhage, unspecified trimester: Secondary | ICD-10-CM

## 2012-08-31 DIAGNOSIS — O9882 Other maternal infectious and parasitic diseases complicating childbirth: Secondary | ICD-10-CM

## 2012-08-31 DIAGNOSIS — O365990 Maternal care for other known or suspected poor fetal growth, unspecified trimester, not applicable or unspecified: Secondary | ICD-10-CM

## 2012-08-31 LAB — CBC
HCT: 27.9 % — ABNORMAL LOW (ref 36.0–46.0)
HCT: 27.5 % — ABNORMAL LOW (ref 36.0–46.0)
Hemoglobin: 8.1 g/dL — ABNORMAL LOW (ref 12.0–15.0)
Hemoglobin: 7.9 g/dL — ABNORMAL LOW (ref 12.0–15.0)
MCH: 17 pg — ABNORMAL LOW (ref 26.0–34.0)
MCHC: 29 g/dL — ABNORMAL LOW (ref 30.0–36.0)
MCHC: 28.7 g/dL — ABNORMAL LOW (ref 30.0–36.0)
MCV: 59 fL — ABNORMAL LOW (ref 78.0–100.0)
RDW: 25.4 % — ABNORMAL HIGH (ref 11.5–15.5)
WBC: 16.2 10*3/uL — ABNORMAL HIGH (ref 4.0–10.5)

## 2012-08-31 LAB — PREPARE RBC (CROSSMATCH)

## 2012-08-31 LAB — FIBRINOGEN: Fibrinogen: 519 mg/dL — ABNORMAL HIGH (ref 204–475)

## 2012-08-31 SURGERY — Surgical Case
Anesthesia: Regional

## 2012-08-31 SURGERY — Surgical Case
Anesthesia: Spinal | Site: Abdomen | Wound class: Clean Contaminated

## 2012-08-31 MED ORDER — MEPERIDINE HCL 25 MG/ML IJ SOLN: 6.2500 mg | INTRAMUSCULAR | Status: DC | PRN

## 2012-08-31 MED ORDER — DIPHENHYDRAMINE HCL 50 MG/ML IJ SOLN: 12.5000 mg | INTRAMUSCULAR | Status: DC | PRN

## 2012-08-31 MED ORDER — DEXTROSE 5 % IV SOLN
3.0000 g | INTRAVENOUS | Status: AC
  Administered 2012-08-31: 3 g via INTRAVENOUS
  Filled 2012-08-31: qty 3000

## 2012-08-31 MED ORDER — METOCLOPRAMIDE HCL 5 MG/ML IJ SOLN: 10.0000 mg | Freq: Three times a day (TID) | INTRAMUSCULAR | Status: DC | PRN

## 2012-08-31 MED ORDER — HYDROMORPHONE HCL PF 1 MG/ML IJ SOLN
INTRAMUSCULAR | Status: AC
  Filled 2012-08-31: qty 1

## 2012-08-31 MED ORDER — DIPHENHYDRAMINE HCL 50 MG/ML IJ SOLN: 25.0000 mg | INTRAMUSCULAR | Status: DC | PRN

## 2012-08-31 MED ORDER — LANOLIN HYDROUS EX OINT: 1.0000 "application " | TOPICAL_OINTMENT | CUTANEOUS | Status: DC | PRN

## 2012-08-31 MED ORDER — OXYTOCIN 10 UNIT/ML IJ SOLN
40.0000 [IU] | INTRAVENOUS | Status: DC | PRN
  Administered 2012-08-31: 40 [IU] via INTRAVENOUS

## 2012-08-31 MED ORDER — SODIUM BICARBONATE 8.4 % IV SOLN
INTRAVENOUS | Status: DC | PRN
  Administered 2012-08-31: 3 mL via EPIDURAL

## 2012-08-31 MED ORDER — SODIUM CHLORIDE 0.9 % IJ SOLN
INTRAMUSCULAR | Status: AC
  Filled 2012-08-31: qty 3

## 2012-08-31 MED ORDER — MEASLES, MUMPS & RUBELLA VAC ~~LOC~~ INJ: 0.5000 mL | INJECTION | Freq: Once | SUBCUTANEOUS | Status: DC

## 2012-08-31 MED ORDER — HYDROMORPHONE HCL PF 1 MG/ML IJ SOLN
0.2500 mg | INTRAMUSCULAR | Status: DC | PRN
  Administered 2012-08-31: 0.5 mg via INTRAVENOUS

## 2012-08-31 MED ORDER — IBUPROFEN 600 MG PO TABS: 600.0000 mg | ORAL_TABLET | Freq: Four times a day (QID) | ORAL | Status: DC | PRN

## 2012-08-31 MED ORDER — SIMETHICONE 80 MG PO CHEW
80.0000 mg | CHEWABLE_TABLET | ORAL | Status: DC | PRN
  Administered 2012-09-01: 80 mg via ORAL

## 2012-08-31 MED ORDER — 0.9 % SODIUM CHLORIDE (POUR BTL) OPTIME
TOPICAL | Status: DC | PRN
  Administered 2012-08-31: 1000 mL

## 2012-08-31 MED ORDER — KETOROLAC TROMETHAMINE 60 MG/2ML IM SOLN
60.0000 mg | Freq: Once | INTRAMUSCULAR | Status: AC | PRN
  Administered 2012-08-31: 60 mg via INTRAMUSCULAR

## 2012-08-31 MED ORDER — DIPHENHYDRAMINE HCL 25 MG PO CAPS
25.0000 mg | ORAL_CAPSULE | Freq: Four times a day (QID) | ORAL | Status: DC | PRN
  Administered 2012-09-01: 25 mg via ORAL
  Filled 2012-08-31: qty 1

## 2012-08-31 MED ORDER — LACTATED RINGERS IV SOLN
INTRAVENOUS | Status: DC | PRN
  Administered 2012-08-31 (×2): via INTRAVENOUS

## 2012-08-31 MED ORDER — LACTATED RINGERS IV SOLN
INTRAVENOUS | Status: DC
Start: 2012-08-31 — End: 2012-09-02
  Administered 2012-08-31: 18:00:00 via INTRAVENOUS

## 2012-08-31 MED ORDER — PHENYLEPHRINE HCL 10 MG/ML IJ SOLN
INTRAMUSCULAR | Status: DC | PRN
  Administered 2012-08-31 (×2): 40 ug via INTRAVENOUS

## 2012-08-31 MED ORDER — CITRIC ACID-SODIUM CITRATE 334-500 MG/5ML PO SOLN
ORAL | Status: AC
  Administered 2012-08-31: 30 mL
  Filled 2012-08-31: qty 15

## 2012-08-31 MED ORDER — NALBUPHINE HCL 10 MG/ML IJ SOLN
5.0000 mg | INTRAMUSCULAR | Status: DC | PRN
  Administered 2012-08-31 – 2012-09-01 (×2): 5 mg via SUBCUTANEOUS

## 2012-08-31 MED ORDER — LACTATED RINGERS IV SOLN: INTRAVENOUS | Status: DC

## 2012-08-31 MED ORDER — ONDANSETRON HCL 4 MG/2ML IJ SOLN: 4.0000 mg | INTRAMUSCULAR | Status: DC | PRN

## 2012-08-31 MED ORDER — MENTHOL 3 MG MT LOZG: 1.0000 | LOZENGE | OROMUCOSAL | Status: DC | PRN

## 2012-08-31 MED ORDER — KETOROLAC TROMETHAMINE 60 MG/2ML IM SOLN
INTRAMUSCULAR | Status: AC
  Filled 2012-08-31: qty 2

## 2012-08-31 MED ORDER — FENTANYL CITRATE 0.05 MG/ML IJ SOLN
INTRAMUSCULAR | Status: DC | PRN
  Administered 2012-08-31 (×2): 50 ug via INTRAVENOUS

## 2012-08-31 MED ORDER — ONDANSETRON HCL 4 MG/2ML IJ SOLN: 4.0000 mg | Freq: Three times a day (TID) | INTRAMUSCULAR | Status: DC | PRN

## 2012-08-31 MED ORDER — NALOXONE HCL 0.4 MG/ML IJ SOLN: 0.4000 mg | INTRAMUSCULAR | Status: DC | PRN

## 2012-08-31 MED ORDER — SCOPOLAMINE 1 MG/3DAYS TD PT72
1.0000 | MEDICATED_PATCH | Freq: Once | TRANSDERMAL | Status: DC
  Administered 2012-08-31: 1.5 mg via TRANSDERMAL

## 2012-08-31 MED ORDER — SCOPOLAMINE 1 MG/3DAYS TD PT72
MEDICATED_PATCH | TRANSDERMAL | Status: AC
  Filled 2012-08-31: qty 1

## 2012-08-31 MED ORDER — WITCH HAZEL-GLYCERIN EX PADS: 1.0000 "application " | MEDICATED_PAD | CUTANEOUS | Status: DC | PRN

## 2012-08-31 MED ORDER — LACTATED RINGERS IV SOLN
INTRAVENOUS | Status: DC | PRN
Start: 2012-08-31 — End: 2012-08-31
  Administered 2012-08-31 (×2): via INTRAVENOUS

## 2012-08-31 MED ORDER — PRENATAL MULTIVITAMIN CH
1.0000 | ORAL_TABLET | Freq: Every day | ORAL | Status: DC
  Administered 2012-09-01 – 2012-09-02 (×2): 1 via ORAL
  Filled 2012-08-31 (×2): qty 1

## 2012-08-31 MED ORDER — KETOROLAC TROMETHAMINE 30 MG/ML IJ SOLN: 30.0000 mg | Freq: Four times a day (QID) | INTRAMUSCULAR | Status: AC | PRN

## 2012-08-31 MED ORDER — ONDANSETRON HCL 4 MG PO TABS: 4.0000 mg | ORAL_TABLET | ORAL | Status: DC | PRN

## 2012-08-31 MED ORDER — OXYTOCIN 40 UNITS IN LACTATED RINGERS INFUSION - SIMPLE MED: 62.5000 mL/h | INTRAVENOUS | Status: AC

## 2012-08-31 MED ORDER — DIPHENHYDRAMINE HCL 25 MG PO CAPS: 25.0000 mg | ORAL_CAPSULE | ORAL | Status: DC | PRN

## 2012-08-31 MED ORDER — DEXTROSE 5 % IV SOLN: 1.0000 ug/kg/h | INTRAVENOUS | Status: DC | PRN

## 2012-08-31 MED ORDER — DIBUCAINE 1 % RE OINT: 1.0000 "application " | TOPICAL_OINTMENT | RECTAL | Status: DC | PRN

## 2012-08-31 MED ORDER — IBUPROFEN 600 MG PO TABS
600.0000 mg | ORAL_TABLET | Freq: Four times a day (QID) | ORAL | Status: DC
  Administered 2012-09-01 – 2012-09-02 (×5): 600 mg via ORAL
  Filled 2012-08-31 (×7): qty 1

## 2012-08-31 MED ORDER — SODIUM CHLORIDE 0.9 % IJ SOLN: 3.0000 mL | INTRAMUSCULAR | Status: DC | PRN

## 2012-08-31 MED ORDER — ONDANSETRON HCL 4 MG/2ML IJ SOLN
INTRAMUSCULAR | Status: DC | PRN
  Administered 2012-08-31: 4 mg via INTRAVENOUS

## 2012-08-31 MED ORDER — NALBUPHINE HCL 10 MG/ML IJ SOLN
5.0000 mg | INTRAMUSCULAR | Status: DC | PRN
  Administered 2012-08-31: 5 mg via INTRAVENOUS
  Filled 2012-08-31 (×2): qty 1

## 2012-08-31 MED ORDER — EPHEDRINE SULFATE 50 MG/ML IJ SOLN
INTRAMUSCULAR | Status: DC | PRN
  Administered 2012-08-31: 10 mg via INTRAVENOUS
  Administered 2012-08-31: 5 mg via INTRAVENOUS
  Administered 2012-08-31 (×5): 10 mg via INTRAVENOUS

## 2012-08-31 MED ORDER — MORPHINE SULFATE (PF) 0.5 MG/ML IJ SOLN
INTRAMUSCULAR | Status: DC | PRN
  Administered 2012-08-31: 4 mg via EPIDURAL

## 2012-08-31 MED ORDER — TETANUS-DIPHTH-ACELL PERTUSSIS 5-2.5-18.5 LF-MCG/0.5 IM SUSP: 0.5000 mL | Freq: Once | INTRAMUSCULAR | Status: DC

## 2012-08-31 SURGICAL SUPPLY — 32 items
CLOTH BEACON ORANGE TIMEOUT ST (SAFETY) ×2 IMPLANT
CONTAINER PREFILL 10% NBF 15ML (MISCELLANEOUS) IMPLANT
DRAIN JACKSON PRT FLT 7MM (DRAIN) IMPLANT
DRAPE LG THREE QUARTER DISP (DRAPES) ×2 IMPLANT
DRESSING TELFA 8X3 (GAUZE/BANDAGES/DRESSINGS) IMPLANT
DRSG OPSITE POSTOP 4X10 (GAUZE/BANDAGES/DRESSINGS) ×2 IMPLANT
DURAPREP 26ML APPLICATOR (WOUND CARE) ×2 IMPLANT
ELECT REM PT RETURN 9FT ADLT (ELECTROSURGICAL) ×2
ELECTRODE REM PT RTRN 9FT ADLT (ELECTROSURGICAL) ×1 IMPLANT
EVACUATOR SILICONE 100CC (DRAIN) IMPLANT
EXTRACTOR VACUUM M CUP 4 TUBE (SUCTIONS) IMPLANT
GAUZE SPONGE 4X4 12PLY STRL LF (GAUZE/BANDAGES/DRESSINGS) ×4 IMPLANT
GLOVE BIO SURGEON STRL SZ7 (GLOVE) ×2 IMPLANT
GLOVE BIOGEL PI IND STRL 7.0 (GLOVE) ×2 IMPLANT
GLOVE BIOGEL PI INDICATOR 7.0 (GLOVE) ×2
GOWN PREVENTION PLUS LG XLONG (DISPOSABLE) ×6 IMPLANT
KIT ABG SYR 3ML LUER SLIP (SYRINGE) IMPLANT
NEEDLE HYPO 25X5/8 SAFETYGLIDE (NEEDLE) ×2 IMPLANT
NS IRRIG 1000ML POUR BTL (IV SOLUTION) ×2 IMPLANT
PACK C SECTION WH (CUSTOM PROCEDURE TRAY) ×2 IMPLANT
PAD ABD 7.5X8 STRL (GAUZE/BANDAGES/DRESSINGS) IMPLANT
PAD OB MATERNITY 4.3X12.25 (PERSONAL CARE ITEMS) IMPLANT
RTRCTR C-SECT PINK 25CM LRG (MISCELLANEOUS) ×2 IMPLANT
SLEEVE SCD COMPRESS KNEE MED (MISCELLANEOUS) IMPLANT
STAPLER VISISTAT 35W (STAPLE) IMPLANT
SUT PLAIN 2 0 XLH (SUTURE) ×2 IMPLANT
SUT VIC AB 0 CTX 36 (SUTURE) ×5
SUT VIC AB 0 CTX36XBRD ANBCTRL (SUTURE) ×5 IMPLANT
SUT VIC AB 4-0 KS 27 (SUTURE) IMPLANT
TOWEL OR 17X24 6PK STRL BLUE (TOWEL DISPOSABLE) ×6 IMPLANT
TRAY FOLEY CATH 14FR (SET/KITS/TRAYS/PACK) ×2 IMPLANT
WATER STERILE IRR 1000ML POUR (IV SOLUTION) ×2 IMPLANT

## 2012-08-31 SURGICAL SUPPLY — 31 items
CLOTH BEACON ORANGE TIMEOUT ST (SAFETY) IMPLANT
CONTAINER PREFILL 10% NBF 15ML (MISCELLANEOUS) IMPLANT
DRAIN JACKSON PRT FLT 7MM (DRAIN) IMPLANT
DRAPE LG THREE QUARTER DISP (DRAPES) IMPLANT
DRESSING TELFA 8X3 (GAUZE/BANDAGES/DRESSINGS) IMPLANT
DRSG OPSITE POSTOP 4X10 (GAUZE/BANDAGES/DRESSINGS) IMPLANT
DURAPREP 26ML APPLICATOR (WOUND CARE) IMPLANT
ELECT REM PT RETURN 9FT ADLT (ELECTROSURGICAL)
ELECTRODE REM PT RTRN 9FT ADLT (ELECTROSURGICAL) IMPLANT
EVACUATOR SILICONE 100CC (DRAIN) IMPLANT
EXTRACTOR VACUUM M CUP 4 TUBE (SUCTIONS) IMPLANT
GAUZE SPONGE 4X4 12PLY STRL LF (GAUZE/BANDAGES/DRESSINGS) IMPLANT
GLOVE BIO SURGEON STRL SZ7 (GLOVE) IMPLANT
GLOVE BIOGEL PI IND STRL 7.0 (GLOVE) IMPLANT
GLOVE BIOGEL PI INDICATOR 7.0 (GLOVE)
GOWN PREVENTION PLUS LG XLONG (DISPOSABLE) IMPLANT
KIT ABG SYR 3ML LUER SLIP (SYRINGE) IMPLANT
NEEDLE HYPO 25X5/8 SAFETYGLIDE (NEEDLE) IMPLANT
NS IRRIG 1000ML POUR BTL (IV SOLUTION) IMPLANT
PACK C SECTION WH (CUSTOM PROCEDURE TRAY) IMPLANT
PAD ABD 7.5X8 STRL (GAUZE/BANDAGES/DRESSINGS) IMPLANT
PAD OB MATERNITY 4.3X12.25 (PERSONAL CARE ITEMS) IMPLANT
RTRCTR C-SECT PINK 25CM LRG (MISCELLANEOUS) IMPLANT
SLEEVE SCD COMPRESS KNEE MED (MISCELLANEOUS) IMPLANT
STAPLER VISISTAT 35W (STAPLE) IMPLANT
SUT VIC AB 0 CTX 36 (SUTURE)
SUT VIC AB 0 CTX36XBRD ANBCTRL (SUTURE) IMPLANT
SUT VIC AB 4-0 KS 27 (SUTURE) IMPLANT
TOWEL OR 17X24 6PK STRL BLUE (TOWEL DISPOSABLE) IMPLANT
TRAY FOLEY CATH 14FR (SET/KITS/TRAYS/PACK) IMPLANT
WATER STERILE IRR 1000ML POUR (IV SOLUTION) IMPLANT

## 2012-08-31 NOTE — Anesthesia Postprocedure Evaluation (Signed)
Anesthesia Post Note  Patient: [name]  Procedure(s) Performed: Procedure(s) (LRB): CESAREAN SECTION (N/A)  Anesthesia type: Spinal  Patient location: PACU  Post pain: Pain level controlled  Post assessment: Post-op Vital signs reviewed  Last Vitals:  Filed Vitals:   08/31/12 1300  BP:   Pulse: 86  Temp:   Resp: 23    Post vital signs: Reviewed  Level of consciousness: awake  Complications: No apparent anesthesia complications

## 2012-08-31 NOTE — Anesthesia Preprocedure Evaluation (Addendum)
Anesthesia Evaluation    Airway Mallampati: III TM Distance: <3 FB    Comment: Had GA for Lap Band. Airway examined by Dr Arby Barrette also. We feel she is a patient who could be intubated in OR if MTP triggered Dental   Pulmonary          Cardiovascular     Neuro/Psych    GI/Hepatic   Endo/Other  Morbid obesityHx of GDM  Renal/GU      Musculoskeletal   Abdominal   Peds  Hematology  (+) anemia ,   Anesthesia Other Findings Current IV access inadequate. Discussed central line with patient if we can find Periph IV  Reproductive/Obstetrics                          Anesthesia Physical Anesthesia Plan  ASA: III  Anesthesia Plan: Spinal, Epidural and Combined Spinal and Epidural   Post-op Pain Management:    Induction:   Airway Management Planned:   Additional Equipment:   Intra-op Plan:   Post-operative Plan:   Informed Consent: I have reviewed the patients History and Physical, chart, labs and discussed the procedure including the risks, benefits and alternatives for the proposed anesthesia with the patient or authorized representative who has indicated his/her understanding and acceptance.   Dental Advisory Given  Plan Discussed with: CRNA  Anesthesia Plan Comments: (Lab work confirmed with CRNA in room. Platelets okay. Blood bank alerted to be always +2 for PRBC's Discussed regional anesthetic, and patient consents to the procedure:  included risk of possible headache,backache, failed block, allergic reaction, and nerve injury. This patient was asked if she had any questions or concerns before the procedure started. )       Anesthesia Quick Evaluation

## 2012-08-31 NOTE — H&P (Signed)
Attestation of Attending Supervision of Advanced Practitioner (CNM/NP): Evaluation and management procedures were performed by the Advanced Practitioner under my supervision and collaboration.  I have reviewed the Advanced Practitioner's note and chart, and I agree with the management and plan.  [name], Sharlot Sturkey 7:53 AM

## 2012-08-31 NOTE — Anesthesia Procedure Notes (Signed)
Spinal  Patient location during procedure: OR Preanesthetic Checklist Completed: patient identified, site marked, surgical consent, pre-op evaluation, timeout performed, IV checked, risks and benefits discussed and monitors and equipment checked Spinal Block Patient position: sitting Prep: DuraPrep Patient monitoring: cardiac monitor, continuous pulse ox, blood pressure and heart rate Approach: midline Location: L3-4 Injection technique: catheter Needle Needle type: Tuohy and Sprotte  Needle gauge: 24 G Needle length: 12.7 cm Needle insertion depth: 8 cm Catheter type: closed end flexible Catheter size: 19 g Catheter at skin depth: 15 cm Additional Notes Taped securely (-) heme asp

## 2012-08-31 NOTE — Transfer of Care (Signed)
Immediate Anesthesia Transfer of Care Note  Patient: [name]  Procedure(s) Performed: Procedure(s) (LRB) with comments: CESAREAN SECTION (N/A)  Patient Location: PACU  Anesthesia Type:Spinal and Epidural  Level of Consciousness: awake, alert  and oriented  Airway & Oxygen Therapy: Patient Spontanous Breathing  Post-op Assessment: Report given to PACU RN and Post -op Vital signs reviewed and stable  Post vital signs: Reviewed and stable  Complications: No apparent anesthesia complications

## 2012-08-31 NOTE — Progress Notes (Signed)
Patient c/o itching.  While I was in room, patient falling asleep while I was talking to her.  FOB and 4 children in room, asked for baby to be brought to room.  Cautioned mother not to hold baby if the FOB wasn't at her side because she is falling asleep.  She verbalized understanding.

## 2012-08-31 NOTE — Progress Notes (Signed)
Subjective: Patient reports no problems voiding.  She does not feel different after her transfusion.  No problems.  Objective: I have reviewed patient's vital signs, intake and output, medications and labs.  General: alert and no distress Resp: clear to auscultation bilaterally Cardio: regular rate and rhythm, S1, S2 normal, no murmur, click, rub or gallop GI: soft, non-tender; bowel sounds normal; no masses,  no organomegaly and gravid   Assessment/Plan: Pt s/p transfusion of 2 units of PRBC overnight.  For repeat c-section this am due to h/o prior c-section and placenta previa.  CBC from this am pending  Pt NPO an don call to OR   LOS: 1 day    [name], [name], 7:54 AM

## 2012-08-31 NOTE — Op Note (Signed)
[name] PROCEDURE DATE: 08/30/2012 - 08/31/2012  PREOPERATIVE DIAGNOSIS: Intrauterine pregnancy at  [redacted]w[redacted]d weeks gestation with posterior placenta previa and IUGR  POSTOPERATIVE DIAGNOSIS: The same  PROCEDURE:    Low Transverse Cesarean Section  SURGEON:  Dr. Elsie Lincoln  ASSISTANT: Dr. Tinnie Gens  INDICATIONS: [name] is a 31 y.o. N5A2130 at [redacted]w[redacted]d scheduled for cesarean section secondary placenta previa and IUGR.  The risks of cesarean section discussed with the patient included but were not limited to: bleeding which may require transfusion or reoperation; infection which may require antibiotics; injury to bowel, bladder, ureters or other surrounding organs; injury to the fetus; need for additional procedures including hysterectomy in the event of a life-threatening hemorrhage; placental abnormalities wth subsequent pregnancies, incisional problems, thromboembolic phenomenon and other postoperative/anesthesia complications. The patient concurred with the proposed plan, giving informed written consent for the procedure.    FINDINGS:  Viable female infant in cephalic presentation, clear amniotic fluid.  Intact placenta, three vessel cord.  Grossly normal uterus, ovaries and fallopian tubes.  Moderate amount of adhesions involving anterior abdominal wall. .   ANESTHESIA:    Epidural ESTIMATED BLOOD LOSS: 600 ml SPECIMENS: Placenta sent to pathology COMPLICATIONS: None immediate  PROCEDURE IN DETAIL:  The patient received intravenous antibiotics and had sequential compression devices applied to her lower extremities while in the preoperative area.  She was then taken to the operating room where combine spinal epidural anesthesia was administered. She was then placed in a dorsal supine position with a leftward tilt, and prepped and draped in a sterile manner.  A foley catheter was placed into her bladder and attached to constant gravity.  After an adequate timeout was performed, a  Pfannenstiel skin incision was made with scalpel and carried through to the underlying layer of fascia. The fascia was incised in the midline and this incision was extended bilaterally using the Mayo scissors. Kocher clamps were applied to the superior aspect of the fascial incision and the underlying rectus muscles were dissected off bluntly. A similar process was carried out on the inferior aspect of the facial incision. The rectus muscles were separated in the midline bluntly and the peritoneum was entered bluntly.   A transverse hysterotomy was made with a scalpel and extended bilaterally bluntly. The bladder blade was then removed. The infant was successfully delivered, and cord was clamped and cut and infant was handed over to awaiting neonatology team. Uterine massage was then administered and the placenta delivered intact with three-vessel cord. The uterus was cleared of clot and debris.  The hysterotomy was closed with 0 vicryl.  The peritoneum and rectus muscles were noted to be hemostatic.  The fascia was closed with 0-Vicryl in a running fashion with good restoration of anatomy.  The subcutaneus tissue was copiously irrigated.  The subcutaneous tissue was closed with 0 plain.  The skin was closed with staples.  Pt tolerated the procedure will.  All counts were correct x2.  Pt went to the recovery room in stable condition.

## 2012-09-01 LAB — CBC
HCT: 26.2 % — ABNORMAL LOW (ref 36.0–46.0)
MCH: 16.9 pg — ABNORMAL LOW (ref 26.0–34.0)
MCHC: 28.2 g/dL — ABNORMAL LOW (ref 30.0–36.0)
MCV: 59.8 fL — ABNORMAL LOW (ref 78.0–100.0)
RDW: 25.5 % — ABNORMAL HIGH (ref 11.5–15.5)

## 2012-09-01 MED ORDER — CALCIUM CARBONATE ANTACID 500 MG PO CHEW
2.0000 | CHEWABLE_TABLET | Freq: Three times a day (TID) | ORAL | Status: DC
  Administered 2012-09-01 (×3): 400 mg via ORAL
  Administered 2012-09-02 (×2): 200 mg via ORAL
  Filled 2012-09-01 (×4): qty 1

## 2012-09-01 NOTE — Progress Notes (Signed)
Subjective: Postpartum Day #1: Cesarean Delivery Patient reports no problems voiding; amb without difficulty; bottle; desires Depo then BTL; requesting Tums with meals due to hx of lap band and acid reflux  Objective: Vital signs in last 24 hours: Temp:  [97.5 F (36.4 C)-98.5 F (36.9 C)] 97.9 F (36.6 C) (12/28 0430) Pulse Rate:  [67-96] 79  (12/28 0430) Resp:  [16-32] 18  (12/28 0430) BP: (107-144)/(41-87) 107/66 mmHg (12/28 0430) SpO2:  [98 %-100 %] 99 % (12/28 0430)  Physical Exam:  General: alert, cooperative and mild distress Lochia: appropriate Uterine Fundus: firm Incision: dsg intact; no stains DVT Evaluation: No evidence of DVT seen on physical exam.   Basename 08/31/12 1631 08/31/12 0825  HGB 7.9* 8.1*  HCT 27.5* 27.9*    Assessment/Plan: Status post Cesarean section. Doing well postoperatively.  Continue current care. Tums ordered.  Cam Hai 09/01/2012, 7:47 AM

## 2012-09-01 NOTE — Clinical Social Work Maternal (Signed)
    Clinical Social Work Department PSYCHOSOCIAL ASSESSMENT - MATERNAL/CHILD 09/01/2012  Patient:  [name], [name]  Account Number:  [id number]  Admit Date:  08/30/2012  Marjo Bicker Name:   [name]    Clinical Social Worker:  Nobie Putnam, LCSW   Date/Time:  09/01/2012 01:30 PM  Date Referred:  09/01/2012   Referral source  CN     Referred reason  Depression/Anxiety  Substance Abuse   Other referral source:    I:  FAMILY / HOME ENVIRONMENT Child's legal guardian:  PARENT  Guardian - Name Guardian - Age Guardian - Address  [name] [address]; [place], [place] [address]  [name] 26 (same as above)   Other household support members/support persons Other support:    II  PSYCHOSOCIAL DATA Information Source:  Patient Interview  Event organiser Employment:   Surveyor, quantity resources:  [employer] If Medicaid - County:  [employer] Clinical biochemist  WIC   School / Grade:   Maternity Care Coordinator / Child Services Coordination / Early Interventions:  Cultural issues impacting care:    III  STRENGTHS Strengths  Adequate Resources  Home prepared for Child (including basic supplies)  Supportive family/friends   Strength comment:    IV  RISK FACTORS AND CURRENT PROBLEMS Current Problem:  YES   Risk Factor & Current Problem Patient Issue Family Issue Risk Factor / Current Problem Comment  Mental Illness Y N Hx of PP depression/anxiety  Substance Abuse Y N Hx of MJ/cocaine use    V  SOCIAL WORK ASSESSMENT CSW referral received to assess pt's history of PP depression and substance use.  Pt acknowledges experiencing PP depression symptoms, after the birth of her child in 2012.  She remembers feeling isolated, withdrawn, lack of interest in the baby and crying a lot.  According to pt, her symptoms lasted a couple of weeks.  She contributes the source of her depression to her inability to nurse, at the time & limited support.  Pt moved home to [facility], [place] to be with family & sought counseling services, which helped symptoms resolve quickly.  She reports feeling okay now but does admit to some depression during the pregnancy.  Her depression symptoms are currently being treated with Zoloft, of which she states is helpful.  Pt's substance use was an issue 3 years ago.  She denies any illegal substance use since then,  UDS is negative, meconium results are pending.  FOB is at the bedside, aware of pt's history and supportive.  They have all the necessary supplies for the infant and good family support.  CSW will continue to monitor drug screen results and make a referral if needed.      VI SOCIAL WORK PLAN Social Work Plan  No Further Intervention Required / No Barriers to Discharge   Type of pt/family education:   If child protective services report - county:   If child protective services report - date:   Information/referral to community resources comment:   Other social work plan:

## 2012-09-01 NOTE — Addendum Note (Signed)
Addendum  created 09/01/12 2536 by Shanon Payor, CRNA   Modules edited:Notes Section

## 2012-09-01 NOTE — Progress Notes (Signed)
Pt refused to keep IV, and demanded to have them D/C'd.  Luci Bank with FP notified.  Pt made aware of needed for emergency the IV would be replaced.  Pt gave verbal understanding.

## 2012-09-01 NOTE — Anesthesia Postprocedure Evaluation (Signed)
  Anesthesia Post-op Note  Patient: [name]  Procedure(s) Performed: Procedure(s) (LRB) with comments: CESAREAN SECTION (N/A)  Patient Location: Mother/Baby  Anesthesia Type:Epidural  Level of Consciousness: awake, alert  and oriented  Airway and Oxygen Therapy: Patient Spontanous Breathing  Post-op Pain: mild  Post-op Assessment: Post-op Vital signs reviewed, Patient's Cardiovascular Status Stable, No headache, No backache, No residual numbness and No residual motor weakness  Post-op Vital Signs: Reviewed and stable  Complications: No apparent anesthesia complications

## 2012-09-02 MED ORDER — NYSTATIN 100000 UNIT/ML MT SUSP
5.0000 mL | Freq: Four times a day (QID) | OROMUCOSAL | Status: DC
  Administered 2012-09-02 (×3): 500000 [IU] via ORAL
  Filled 2012-09-02 (×11): qty 5

## 2012-09-02 MED ORDER — IBUPROFEN 600 MG PO TABS: 600.0000 mg | ORAL_TABLET | Freq: Four times a day (QID) | ORAL | Status: DC | PRN

## 2012-09-02 MED ORDER — HYDROCODONE-ACETAMINOPHEN 5-325 MG PO TABS: 1.0000 | ORAL_TABLET | Freq: Four times a day (QID) | ORAL | Status: DC | PRN

## 2012-09-02 MED ORDER — NYSTATIN 100000 UNIT/ML MT SUSP: 5.0000 mL | Freq: Four times a day (QID) | OROMUCOSAL | Status: DC | PRN

## 2012-09-02 NOTE — Discharge Summary (Signed)
Obstetric Discharge Summary Reason for Admission: Placenta previa and IUGR requiring RLTCS @ 36.3wks, w/ transfusion of 2u prbc prior to c/s Prenatal Procedures: ultrasound and blood transfusion Intrapartum Procedures: cesarean: low cervical, transverse Postpartum Procedures: none Complications-Operative and Postpartum: none Eating, drinking, voiding, ambulating well.  +flatus.  Lochia and pain wnl.  Denies lightheadedness, dizziness, sob. No complaints.  Desires early d/c  HGB  Date Value Range Status  05/16/2012 7.0* 11.6 - 15.9 g/dL Final     Hemoglobin  Date Value Range Status  09/01/2012 7.4* 12.0 - 15.0 g/dL Final     HCT  Date Value Range Status  09/01/2012 26.2* 36.0 - 46.0 % Final  05/16/2012 25.2* 34.8 - 46.6 % Final    Physical Exam:  General: alert, cooperative and no distress Lochia: appropriate Uterine Fundus: firm Incision: healing well, no significant drainage, no dehiscence, no significant erythema, honeycomb dressing intact DVT Evaluation: No evidence of DVT seen on physical exam. Negative Homan's sign. No cords or calf tenderness. No significant calf/ankle edema. Oropharyngeal candidiasis- reports feeling sores since yesterday  Discharge Diagnoses: Placenta previa and IUGR requiring RLTCS @ 36.3wks, w/ transfusion of 2u prbc prior to c/s, oropharyngeal candidiasis, anemia  Discharge Information: Date: 09/02/2012 Activity: pelvic rest Diet: routine Medications: PNV, Iron, Zoloft (continued), Nystatin, vicodin (allergic to percocet- but does well w/ vicodin) Condition: stable Instructions: refer to practice specific booklet Discharge to: home  Follow-up Information    Follow up with [facility]. Schedule an appointment as soon as possible for a visit in 1 week.   Contact information:   [address] [place] [address] [phone]       for incision check, discuss pp depo/btl  Newborn Data: Live born female    Birth Weight: 4 lb 9.4 oz (2080 g) APGAR: 9, 9  Home with mother. Breast and bottlefeeding, desires depo and then interim BTL  Marge Duncans 09/02/2012, 3:43 AM

## 2012-09-03 ENCOUNTER — Other Ambulatory Visit: Payer: Self-pay | Admitting: Oncology

## 2012-09-03 ENCOUNTER — Encounter (HOSPITAL_COMMUNITY): Payer: Self-pay | Admitting: Obstetrics & Gynecology

## 2012-09-03 NOTE — Progress Notes (Signed)
Post discharge chart review completed.  

## 2012-09-03 NOTE — Progress Notes (Signed)
NST 12-23 reactive

## 2012-09-04 ENCOUNTER — Telehealth: Payer: Self-pay | Admitting: Oncology

## 2012-09-04 NOTE — Telephone Encounter (Signed)
called and left a message that i r/s her appt that she was a no show for      anne

## 2012-09-08 LAB — TYPE AND SCREEN
ABO/RH(D): A POS
Antibody Screen: NEGATIVE
Unit division: 0
Unit division: 0

## 2012-09-11 ENCOUNTER — Ambulatory Visit: Payer: Medicaid Other | Admitting: Oncology

## 2012-09-11 ENCOUNTER — Other Ambulatory Visit: Payer: Medicaid Other | Admitting: Lab

## 2012-09-12 ENCOUNTER — Encounter: Payer: Self-pay | Admitting: *Deleted

## 2012-09-14 ENCOUNTER — Other Ambulatory Visit: Payer: Self-pay | Admitting: Oncology

## 2012-09-14 ENCOUNTER — Telehealth: Payer: Self-pay | Admitting: Oncology

## 2012-09-14 NOTE — Telephone Encounter (Signed)
Per 1.10.14 pof No show x3 with me. Letter has been mailed. DO NOT reschedule patient unless she calls [place].

## 2012-09-19 ENCOUNTER — Ambulatory Visit: Payer: Medicaid Other | Admitting: Obstetrics and Gynecology

## 2012-09-19 VITALS — BP 125/81 | HR 69 | Temp 99.5°F | Wt 220.0 lb

## 2012-09-19 DIAGNOSIS — Z4802 Encounter for removal of sutures: Secondary | ICD-10-CM

## 2012-09-21 ENCOUNTER — Telehealth: Payer: Self-pay | Admitting: *Deleted

## 2012-09-21 DIAGNOSIS — G8918 Other acute postprocedural pain: Secondary | ICD-10-CM

## 2012-09-21 MED ORDER — IBUPROFEN 600 MG PO TABS: 600.0000 mg | ORAL_TABLET | Freq: Four times a day (QID) | ORAL | Status: AC | PRN

## 2012-09-21 NOTE — Telephone Encounter (Signed)
Called [name] and she states she needs a refill of the ibuprofen 600mg  for pain and tenderness of incision because she has to do some things for her other children - that her pain is not severe. States the incision looks the same as it did on Wednesday-a little pink but no redness, denies drainage, states it has a foul odor that is less than on Wednesday- that the nurse cleaned it on Wednesday and she has showered since. States she doesn't know if she has a fever but has had chills , states she thinks she may also have a stomach bug because she has had diarrhea.  Offered [name] an appointment to be seen in clinic this am but [name] states she can't come this am because she has her newborn.  Discussed with Dr. Marice Potter and informed [name] we are concerned she may have a wound infection and if she does have a fever( patient states she will check)over 100.4, if the incision changes and has redness, discharge, odor changes,any concerns at all - to come to [facility] at any time today or over the weekend- we do not want her to wait until clinic is open Monday. Patient voices understanding. Will send refill on ibuprofen as requested.

## 2012-09-21 NOTE — Telephone Encounter (Signed)
[name] called and left a message stating she had an appt this week to remove her c/s staples. States had a foul odor then and they had some difficulty removing some staples and they cleansed incision and made an appt to come back end of the month. She is calling because she still has a foul odor, but maybe a little less, also states still in pain, and still has tenderness in areas where there is slight openings. Requests a refill to get through til appointment. Called

## 2012-09-24 NOTE — Progress Notes (Signed)
Patient here for appointment to remove staples from transverse C-section. Incision is closed from left until midpoint below umbilicus- this area is very tender and have with slight separation of skin. Otherwise, the incision is pink, has no sign of drainage, with slight odor. Patient did complain pain and states at home she smells a malodorous odor from the incision. Staple removed and incision stayed intact. Reinforced the left side of incision with steri-strip. Wynelle Bourgeois, CNM looked it over as well and agreed to all. Patient satisfied and instructed to call clinic for any change in condition. Patient agrees.

## 2012-10-04 ENCOUNTER — Ambulatory Visit: Payer: Medicaid Other | Admitting: Advanced Practice Midwife

## 2012-11-05 ENCOUNTER — Ambulatory Visit: Payer: Medicaid Other | Admitting: Obstetrics and Gynecology

## 2012-11-07 ENCOUNTER — Other Ambulatory Visit: Payer: Self-pay | Admitting: Obstetrics & Gynecology

## 2012-11-07 ENCOUNTER — Ambulatory Visit (INDEPENDENT_AMBULATORY_CARE_PROVIDER_SITE_OTHER): Payer: Medicaid Other | Admitting: *Deleted

## 2012-11-07 ENCOUNTER — Ambulatory Visit: Payer: Medicaid Other | Admitting: Obstetrics & Gynecology

## 2012-11-07 DIAGNOSIS — Z3049 Encounter for surveillance of other contraceptives: Secondary | ICD-10-CM

## 2012-11-07 MED ORDER — MEDROXYPROGESTERONE ACETATE 150 MG/ML IM SUSP
150.0000 mg | Freq: Once | INTRAMUSCULAR | Status: AC
  Administered 2012-11-07: 150 mg via INTRAMUSCULAR

## 2012-11-07 NOTE — Progress Notes (Signed)
Patient states can not afford to pay for postpartum visit today because pregnancy medicaid has ran out and only has family planning- request to start depoprovera today as discussed with providers when in hospital after c/section. States has not had intercourse in the last 2 weeks, states it was about 3 weeks ago. Will do upt today if negative may start depoprovera today per Sid Falcon, CNM- discussed with [facility] patient request and situation.

## 2012-12-25 ENCOUNTER — Telehealth: Payer: Self-pay | Admitting: *Deleted

## 2012-12-25 DIAGNOSIS — Z331 Pregnant state, incidental: Secondary | ICD-10-CM

## 2012-12-25 MED ORDER — SERTRALINE HCL 100 MG PO TABS: 150.0000 mg | ORAL_TABLET | Freq: Every day | ORAL | Status: AC

## 2012-12-25 NOTE — Telephone Encounter (Addendum)
Pt left message stating that she has PP appt scheduled on 5/9.  She delivered by C/S on 08/30/12. She has been off of her Sertraline because her Medicaid had expired and could not afford the medication. She now has medicaid reinstated and would like Rx for the medication as she says she is depressed and really needs it. She has clinic appt scheduled on 01/11/13. I consulted with Dr. Macon Large and Rx refill for 1 month only was approved. Pt needs to be informed that she will need to be under the care of a mental health provider for evaluation of depression and continuation of medication. Pt's appt on 5/9 has been changed to annual Gyn exam as she delivered too long ago for it to be a true PP visit and she needs Pap. I returned pt's call and left message to call back and state whether or not I can leave details on her voice mail.  4/23  1200- Spoke w/pt and informed her that refill for 1 month supply has been sent to her pharmacy. Pt was also advised that she will need to contact a mental health care provider for evaluation and continuation of this medication. She will not receive additional refills from [place]. 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 She expressed concerns because the field is small in [place] and feels that by accessing care within this field, she will jeopardize her job. She will contact a provider within the private sector rather that [facility].

## 2013-01-11 ENCOUNTER — Ambulatory Visit: Payer: Medicaid Other | Admitting: Obstetrics & Gynecology

## 2013-01-25 ENCOUNTER — Ambulatory Visit: Payer: Medicaid Other

## 2013-11-10 ENCOUNTER — Inpatient Hospital Stay (HOSPITAL_COMMUNITY)
Admission: AD | Admit: 2013-11-10 | Discharge: 2013-11-10 | Disposition: A | Discharge status: Home / Self Care | Payer: Medicaid Other | Source: Ambulatory Visit | Attending: Obstetrics & Gynecology | Admitting: Obstetrics & Gynecology

## 2013-11-10 ENCOUNTER — Encounter (HOSPITAL_COMMUNITY): Payer: Self-pay | Admitting: *Deleted

## 2013-11-10 DIAGNOSIS — O209 Hemorrhage in early pregnancy, unspecified: Secondary | ICD-10-CM

## 2013-11-10 DIAGNOSIS — D649 Anemia, unspecified: Secondary | ICD-10-CM | POA: Insufficient documentation

## 2013-11-10 DIAGNOSIS — M545 Low back pain, unspecified: Secondary | ICD-10-CM | POA: Insufficient documentation

## 2013-11-10 DIAGNOSIS — O2390 Unspecified genitourinary tract infection in pregnancy, unspecified trimester: Secondary | ICD-10-CM | POA: Insufficient documentation

## 2013-11-10 DIAGNOSIS — Z87891 Personal history of nicotine dependence: Secondary | ICD-10-CM | POA: Insufficient documentation

## 2013-11-10 DIAGNOSIS — B373 Candidiasis of vulva and vagina: Secondary | ICD-10-CM

## 2013-11-10 DIAGNOSIS — O99019 Anemia complicating pregnancy, unspecified trimester: Secondary | ICD-10-CM | POA: Insufficient documentation

## 2013-11-10 DIAGNOSIS — B3731 Acute candidiasis of vulva and vagina: Secondary | ICD-10-CM

## 2013-11-10 DIAGNOSIS — O26859 Spotting complicating pregnancy, unspecified trimester: Secondary | ICD-10-CM | POA: Insufficient documentation

## 2013-11-10 LAB — WET PREP, GENITAL
CLUE CELLS WET PREP: NONE SEEN
TRICH WET PREP: NONE SEEN
YEAST WET PREP: NONE SEEN

## 2013-11-10 LAB — CBC
HCT: 24.6 % — ABNORMAL LOW (ref 36.0–46.0)
Hemoglobin: 6.8 g/dL — CL (ref 12.0–15.0)
MCH: 14.7 pg — AB (ref 26.0–34.0)
MCHC: 27.6 g/dL — AB (ref 30.0–36.0)
MCV: 53.2 fL — ABNORMAL LOW (ref 78.0–100.0)
PLATELETS: 372 10*3/uL (ref 150–400)
RBC: 4.62 MIL/uL (ref 3.87–5.11)
RDW: 21.4 % — AB (ref 11.5–15.5)
WBC: 8.6 10*3/uL (ref 4.0–10.5)

## 2013-11-10 LAB — ABO/RH: ABO/RH(D): A POS

## 2013-11-10 MED ORDER — FLUCONAZOLE 150 MG PO TABS: 150.0000 mg | ORAL_TABLET | Freq: Every day | ORAL | Status: AC

## 2013-11-10 NOTE — Progress Notes (Signed)
CRITICAL VALUE ALERT  Critical value received:  Hgb 6.8  Date of notification:  11/10/13  Time of notification:  0400  Critical value read back:yes  Nurse who received alert:  K.Aulani Shipton,RN  MD notified (1st page):  L.Barefoot, NP       Time MD responded:  0400

## 2013-11-10 NOTE — MAU Provider Note (Signed)
History     CSN: [id number]  Arrival date and time: 11/10/13 [id number]   First Provider Initiated Contact with Patient 11/10/13 0310      Chief Complaint  Patient presents with  . Vaginal Bleeding   HPI Comments: [name] 33 y.o. [id number] presents to MAU with vaginal bleeding in pregnancy. She is [redacted] weeks pregnant. She also has some low back pain that is now gone. She lives in [place] and was visiting today when all this happened. She says she felt a " gush " of blood and then just when she wiped. She denies any pain. +FHT . She has severe anemia and is on biweekly Iron infusions  Vaginal Bleeding Associated symptoms include back pain.      Past Medical History  Diagnosis Date  . Pregnant   . H/O varicella   . Yeast infection   . Bacterial infection   . Chlamydia infection   . BV (bacterial vaginosis) 2005  . Pelvic pain in female 2005  . Yeast vaginitis 2011  . Complication of anesthesia 2007    PROLONGED NUMBNESS WITH EPIDURAl  . Blood transfusion 2007    AFTER DELIVERY  . Blood transfusion 2012    AFTER DELIVERY  . Depression 2012    PP  . Anxiety     AS TEEN AFTER LOSS OF GM  . Preterm labor 2004  . Anemia     LONG HISTORY; IV iron 2009  . History of gestational diabetes in prior pregnancy, currently pregnant 03/21/2012  . History of preterm delivery, currently pregnant 03/21/2012    G1=36 weeker  . H/O precipitous labor and deliveries, antepartum 03/21/2012    1hr labors w/ last 2 deliveries  . History of postpartum depression, currently pregnant 03/21/2012  . Uterine fibroid   . Unspecified deficiency anemia 05/11/2012  . Blood transfusion, without reported diagnosis 2007; and 2012    with previous pregnancies.   . Placenta previa   . Gestational diabetes 2007  . Pregnancy induced hypertension 2007    Past Surgical History  Procedure Laterality Date  . Cesarean section      x2 (2007; 2012)  . Dilation and curettage of uterus  2005    SAB  . Laparoscopic  gastric banding  2010  . Wisdom tooth extraction  2007;2012  . Cesarean section  08/31/2012    Procedure: CESAREAN SECTION;  Surgeon: Guss Bunde, MD;  Location: [facility];  Service: Obstetrics;  Laterality: N/A;    Family History  Problem Relation Age of Onset  . Kidney disease Mother   . Parkinsonism Mother   . Seizures Sister   . Hypertension Maternal Aunt   . Kidney disease Maternal Aunt   . Kidney disease Maternal Uncle   . Arthritis Maternal Grandmother   . Depression Maternal Grandmother   . Vision loss Maternal Grandmother   . Hyperlipidemia Maternal Grandmother   . Hypertension Maternal Grandmother   . Rheum arthritis Maternal Grandmother   . Heart disease Maternal Grandfather   . Kidney disease Maternal Grandfather   . Pulmonary embolism Maternal Grandfather   . Sickle cell trait Maternal Grandfather   . Diabetes Father     History  Substance Use Topics  . Smoking status: Former Smoker    Types: Cigarettes    Quit date: 02/04/2012  . Smokeless tobacco: Never Used     Comment: patient states that she quit nd week of may2013  . Alcohol Use: No     Comment: ALCOLHOL ABUSE;  LAST DRANK 2011    Allergies:  Allergies  Allergen Reactions  . Percocet [Oxycodone-Acetaminophen] Hives    hallucinations    Prescriptions prior to admission  Medication Sig Dispense Refill  . acetaminophen (TYLENOL) 500 MG tablet Take 500 mg by mouth every 6 (six) hours as needed.      Marland Kitchen buPROPion (WELLBUTRIN SR) 150 MG 12 hr tablet Take 150 mg by mouth 2 (two) times daily.      . Cimetidine (ACID REDUCER PO) Take by mouth.      . Fe Fum-FePoly-FA-Vit C-Vit B3 (INTEGRA F) 125-1 MG CAPS Take 1 tablet by mouth daily.  30 capsule  1  . ibuprofen (ADVIL,MOTRIN) 600 MG tablet Take 1 tablet (600 mg total) by mouth every 6 (six) hours as needed for pain.  30 tablet  0  . promethazine (PHENERGAN) 25 MG tablet Take 25 mg by mouth every 6 (six) hours as needed for nausea or vomiting.      .  sertraline (ZOLOFT) 100 MG tablet Take 1.5 tablets (150 mg total) by mouth daily.  45 tablet  0    Review of Systems  Constitutional: Negative.   HENT: Negative.   Eyes: Negative.   Respiratory: Negative.   Cardiovascular: Negative.   Gastrointestinal: Negative.   Genitourinary: Positive for vaginal bleeding.       Vaginal bleeding  Musculoskeletal: Positive for back pain.  Skin: Negative.   Neurological: Negative.   Psychiatric/Behavioral: Negative.    Physical Exam   Last menstrual period 09/01/2013, not currently breastfeeding.  Physical Exam  Constitutional: She appears well-developed and well-nourished. No distress.  HENT:  Head: Normocephalic and atraumatic.  Eyes: Pupils are equal, round, and reactive to light.  GI: Soft. Bowel sounds are normal. She exhibits no distension and no mass. There is no tenderness. There is no rebound and no guarding.  Genitourinary:  Genital:External negative Vaginal:no blood/ thick clumpy discharge Cervix:clesed and long Bimanual:negative   Musculoskeletal: Normal range of motion.  Skin: Skin is warm and dry.  Psychiatric: She has a normal mood and affect. Her behavior is normal. Judgment and thought content normal.   Results for orders placed during the hospital encounter of 11/10/13 (from the past 24 hour(s))  WET PREP, GENITAL     Status: Abnormal   Collection Time    11/10/13  3:15 AM      Result Value Ref Range   Yeast Wet Prep HPF POC NONE SEEN  NONE SEEN   Trich, Wet Prep NONE SEEN  NONE SEEN   Clue Cells Wet Prep HPF POC NONE SEEN  NONE SEEN   WBC, Wet Prep HPF POC FEW (*) NONE SEEN  CBC     Status: Abnormal   Collection Time    11/10/13  3:45 AM      Result Value Ref Range   WBC 8.6  4.0 - 10.5 K/uL   RBC 4.62  3.87 - 5.11 MIL/uL   Hemoglobin 6.8 (*) 12.0 - 15.0 g/dL   HCT 24.6 (*) 36.0 - 46.0 %   MCV 53.2 (*) 78.0 - 100.0 fL   MCH 14.7 (*) 26.0 - 34.0 pg   MCHC 27.6 (*) 30.0 - 36.0 g/dL   RDW 21.4 (*) 11.5 - 15.5  %   Platelets 372  150 - 400 K/uL  ABO/RH     Status: None   Collection Time    11/10/13  3:45 AM      Result Value Ref Range   ABO/RH(D) A POS  MAU Course  Procedures  MDM CBC, UA, Wet prep  Assessment and Plan   A: Spotting in Pregnancy Presumptive vaginal yeast  P: Advised pelvic rest Diflucan 150 mg po Follow up with OB in [place] Return if sx worsen  [name] 11/10/2013, 3:31 AM

## 2013-11-10 NOTE — Discharge Instructions (Signed)
Candidal Vulvovaginitis °Candidal vulvovaginitis is an infection of the vagina and vulva. The vulva is the skin around the opening of the vagina. This may cause itching and discomfort in and around the vagina.  °HOME CARE °· Only take medicine as told by your doctor. °· Do not have sex (intercourse) until the infection is healed or as told by your doctor. °· Practice safe sex. °· Tell your sex partner about your infection. °· Do not douche or use tampons. °· Wear cotton underwear. Do not wear tight pants or panty hose. °· Eat yogurt. This may help treat and prevent yeast infections. °GET HELP RIGHT AWAY IF:  °· You have a fever. °· Your problems get worse during treatment or do not get better in 3 days. °· You have discomfort, irritation, or itching in your vagina or vulva area. °· You have pain after sex. °· You start to get belly (abdominal) pain. °MAKE SURE YOU: °· Understand these instructions. °· Will watch your condition. °· Will get help right away if you are not doing well or get worse. °Document Released: 11/18/2008 Document Revised: 11/14/2011 Document Reviewed: 11/18/2008 °ExitCare® Patient Information ©2014 [employer]. ° °Pelvic Rest °Pelvic rest is sometimes recommended for women when:  °· The placenta is partially or completely covering the opening of the cervix (placenta previa). °· There is bleeding between the uterine wall and the amniotic sac in the first trimester (subchorionic hemorrhage). °· The cervix begins to open without labor starting (incompetent cervix, cervical insufficiency). °· The labor is too early (preterm labor). °HOME CARE INSTRUCTIONS °· Do not have sexual intercourse, stimulation, or an orgasm. °· Do not use tampons, douche, or put anything in the vagina. °· Do not lift anything over 10 pounds (4.5 kg). °· Avoid strenuous activity or straining your pelvic muscles. °SEEK MEDICAL CARE IF:  °· You have any vaginal bleeding during pregnancy. Treat this as a potential  emergency. °· You have cramping pain felt low in the stomach (stronger than menstrual cramps). °· You notice vaginal discharge (watery, mucus, or bloody). °· You have a low, dull backache. °· There are regular contractions or uterine tightening. °SEEK IMMEDIATE MEDICAL CARE IF: °You have vaginal bleeding and have placenta previa.  °Document Released: 12/17/2010 Document Revised: 11/14/2011 Document Reviewed: 12/17/2010 °ExitCare® Patient Information ©2014 [employer]. ° °

## 2013-11-10 NOTE — MAU Note (Signed)
Pt reprots she had some back pain earlier in the evening. No pain now. About 30 min ago she felt wetness and went to BR and noticed some blood when she wiped. Blood is more brown now.

## 2014-03-01 IMAGING — US US OB TRANSVAGINAL
1 series · 13 of 28 positions shown · non-contrast
Comparison: None.

CLINICAL DATA: Bleeding and cramping.  Estimated gestational age by
LMP is 6 weeks 5 days.  Quantitative beta HCG is not recorded.

OBSTETRIC <14 WK US AND TRANSVAGINAL OB US
TECHNIQUE: Both transabdominal and transvaginal ultrasound
examinations were performed for complete evaluation of the
gestation as well as the maternal uterus, adnexal regions, and
pelvic cul-de-sac.  Transvaginal technique was performed to assess
early pregnancy.

[Series 1: us ob comp less 14 wks · 13 of 44 slices shown]
[im 2/44]
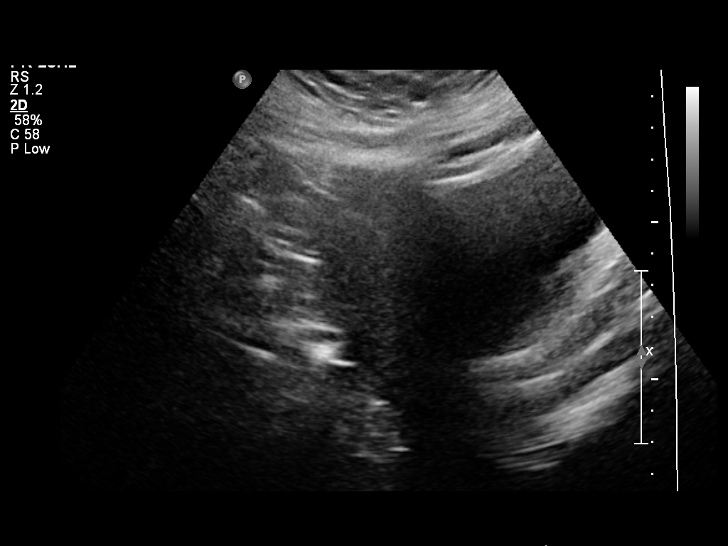
[im 5/44]
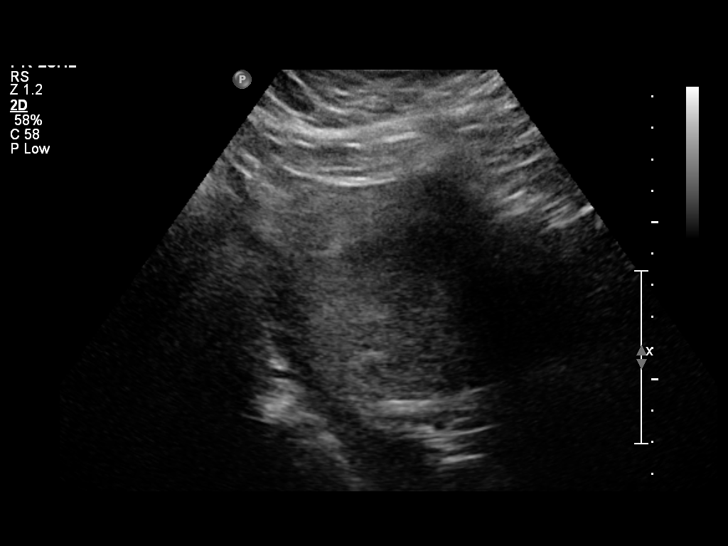
[im 8/44]
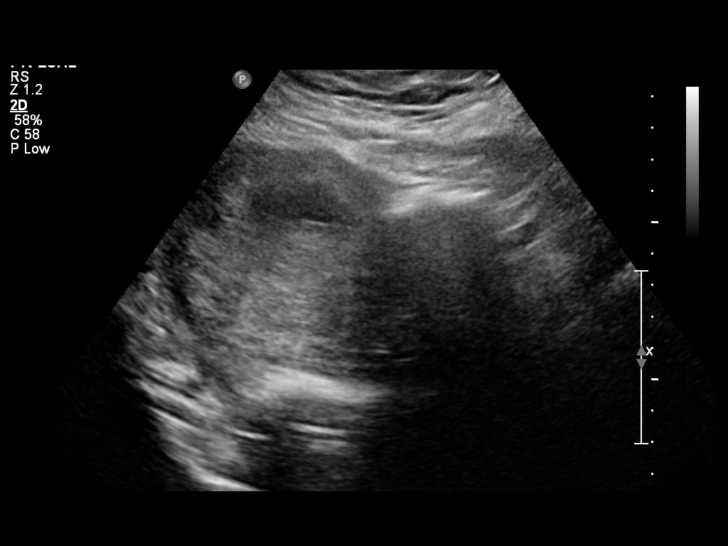
[im 12/44]
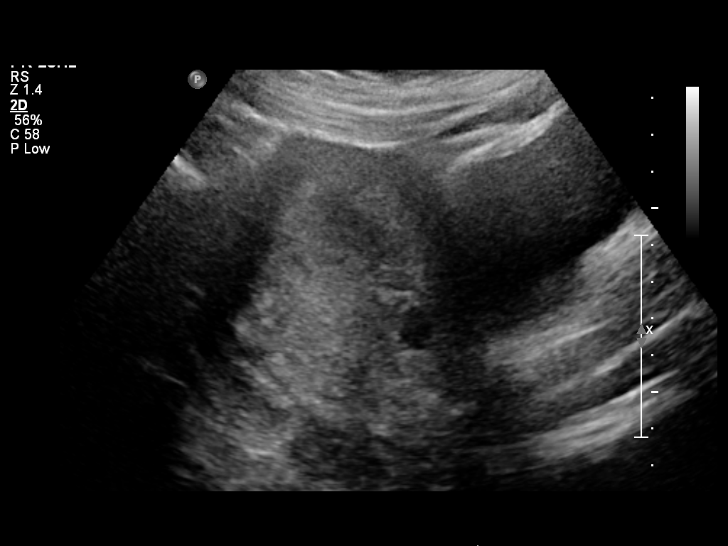
[im 15/44]
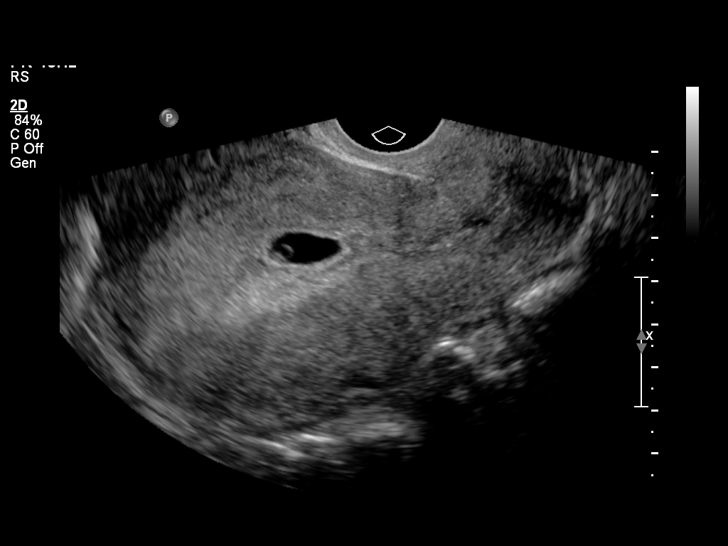
[im 18/44]
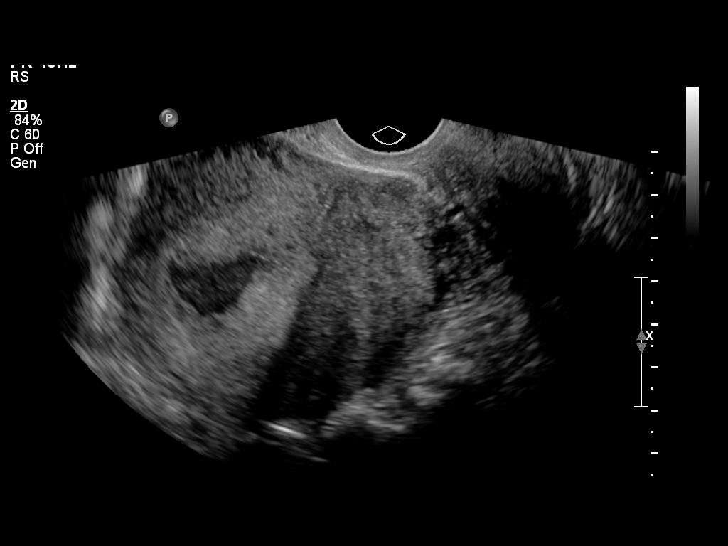
[im 23/44]
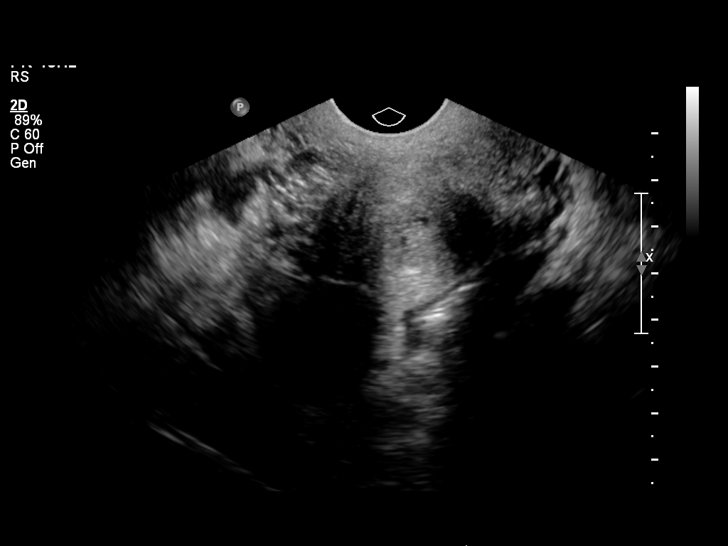
[im 26/44]
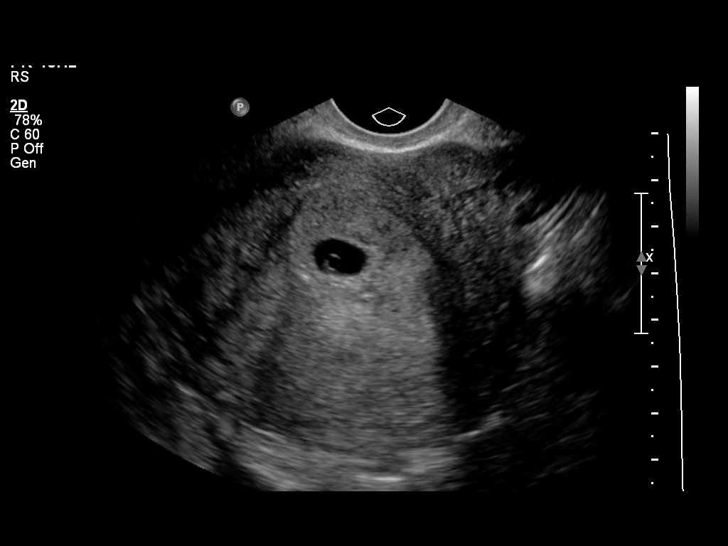
[im 29/44]
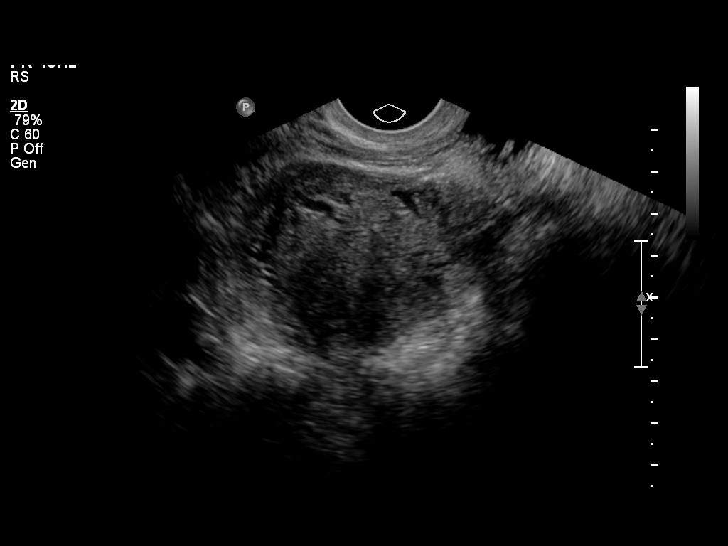
[im 32/44]
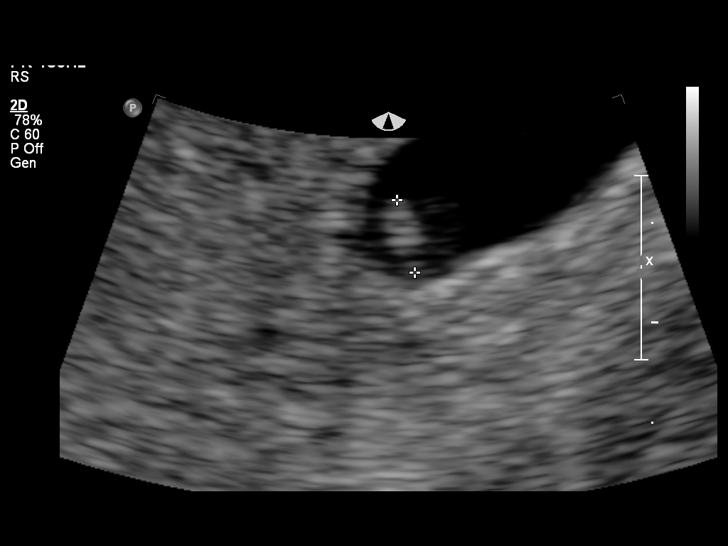
[im 36/44]
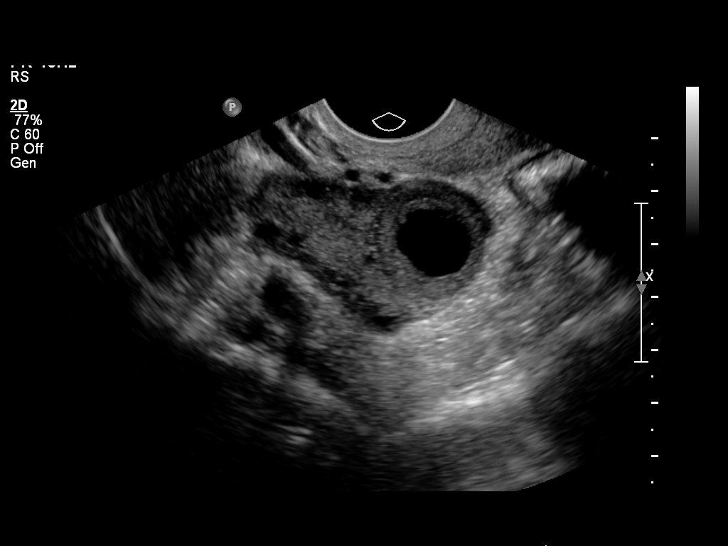
[im 39/44]
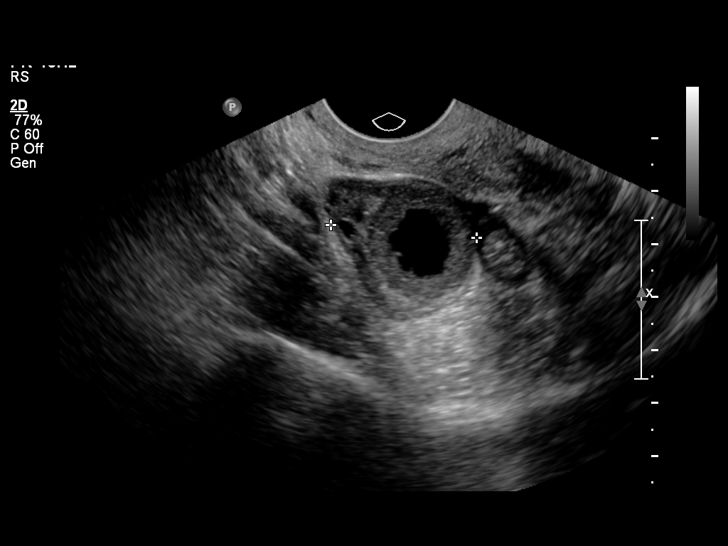
[im 42/44]
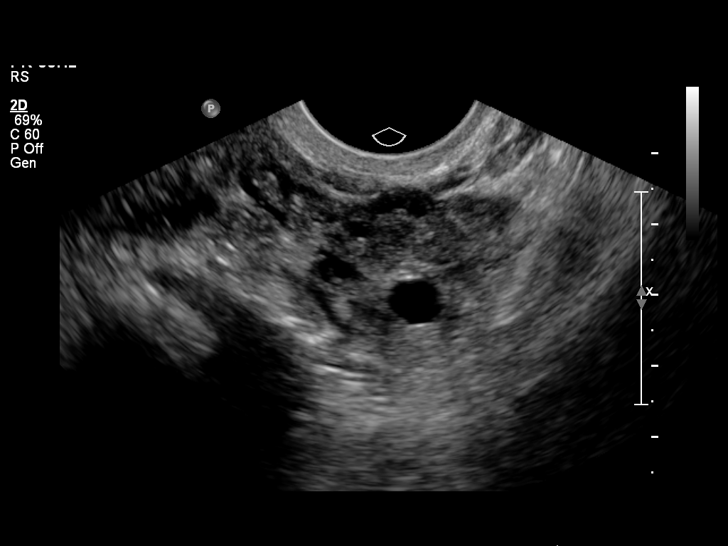

[13 of 28 positions shown; findings below may reference images not displayed]

Intrauterine gestational sac:  A single intrauterine gestational
sac is visualized.
Yolk sac: A yolk sac is visualized.
Embryo: Fetal pole is visualized.
Cardiac Activity: Fetal cardiac activity is observed.
Heart Rate: 122 bpm

CRL: 4.6   mm  6   w  2   d         US EDC: 09/28/2012

Maternal uterus/adnexae:
Focal collection of heterogeneous echogenic material adjacent to
the gestational sac consistent with moderate sized subchorionic
hemorrhage.

The right ovary measures 2.8 x 4.5 x 2.9 cm and contains a central
thick-walled cyst consistent with a corpus luteum.  Flow is
demonstrated within the ovary on color flow Doppler imaging.

The left ovary measures 3.9 x 2.2 x 2.8 cm and contains normal
follicular changes.  Flow is demonstrated in the left ovary on
color flow Doppler imaging.
IMPRESSION: Single intrauterine gestational sac is visualized.  Estimated
gestational age by crown-rump length is 6-week 2 days.  A moderate
sized subchorionic hemorrhage is noted.

## 2014-03-29 IMAGING — US US OB TRANSVAGINAL
1 series · 14 of 21 positions shown · non-contrast
Comparison: 02/05/2012

CLINICAL DATA: Pregnant, vaginal bleeding.

TRANSVAGINAL OBSTETRIC US
TECHNIQUE: Transvaginal ultrasound was performed for complete
evaluation of the gestation as well as the maternal uterus, adnexal
regions, and pelvic cul-de-sac.

[Series 1: us ob transvaginal · 14 of 21 slices shown]
[im 1/21]
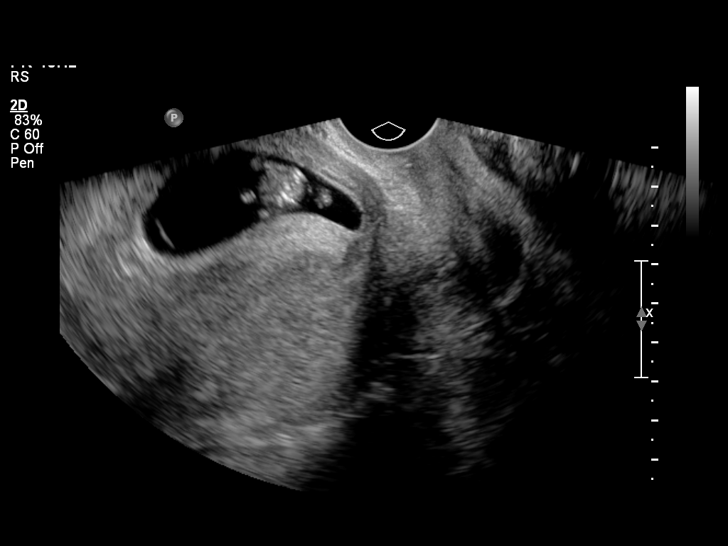
[im 3/21]
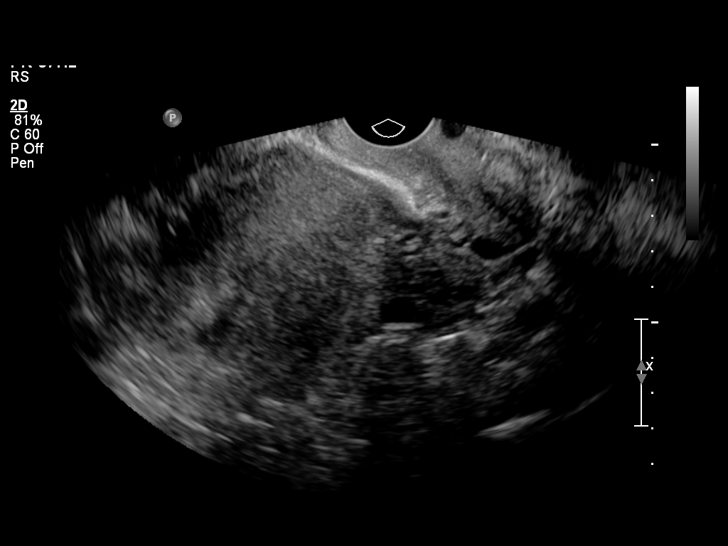
[im 4/21]
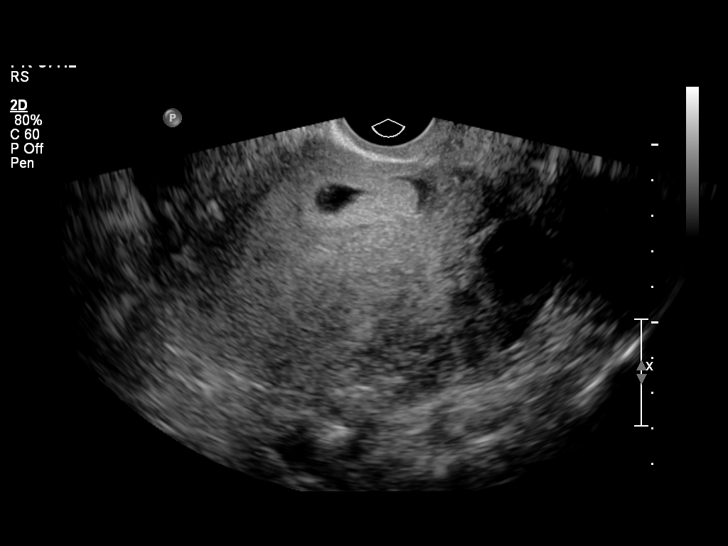
[im 6/21]
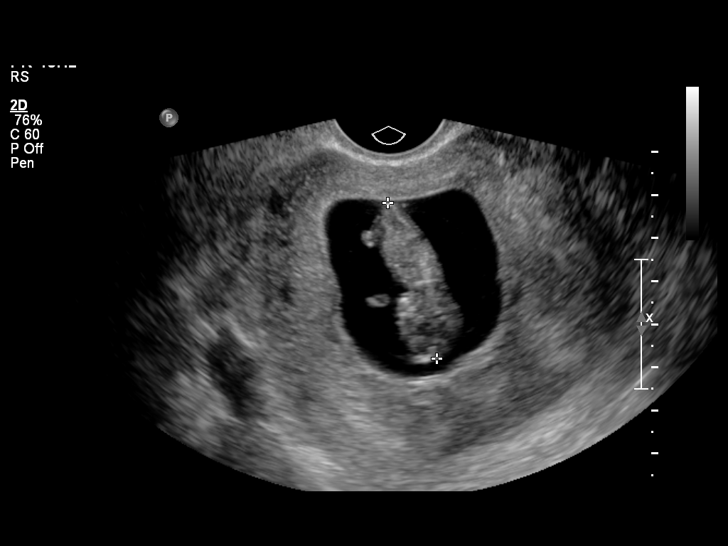
[im 7/21]
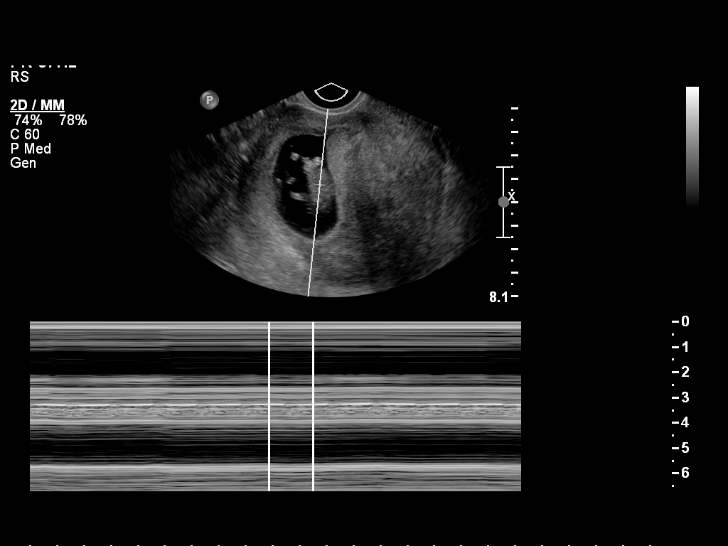
[im 9/21]
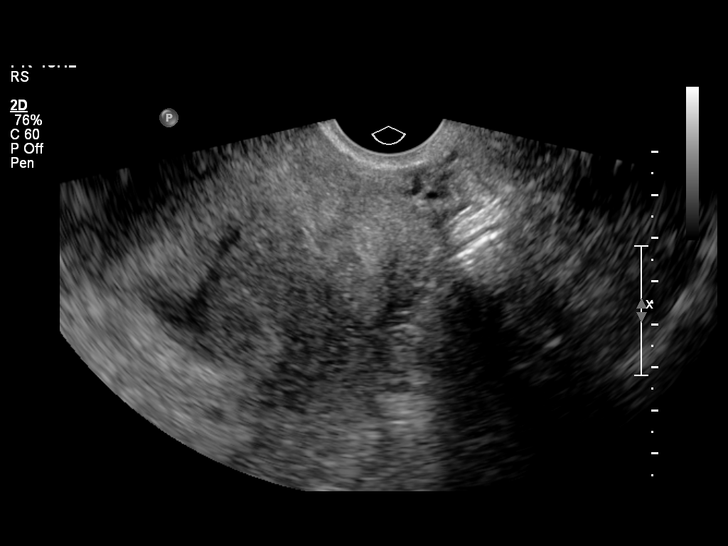
[im 10/21]
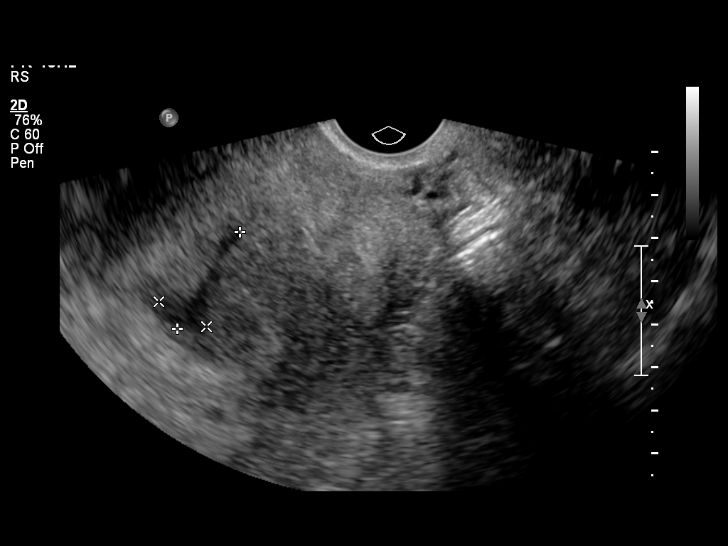
[im 12/21]
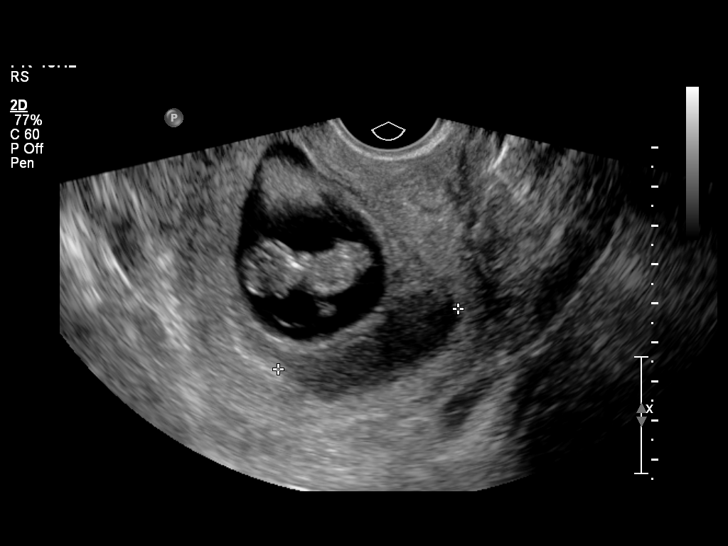
[im 13/21]
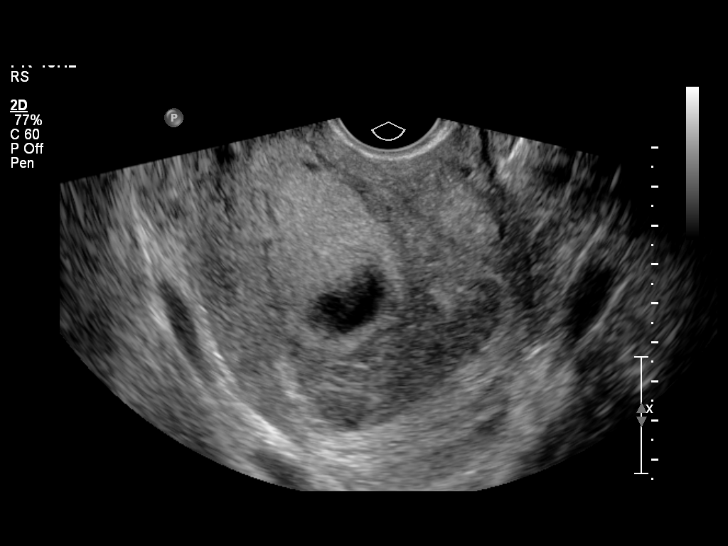
[im 15/21]
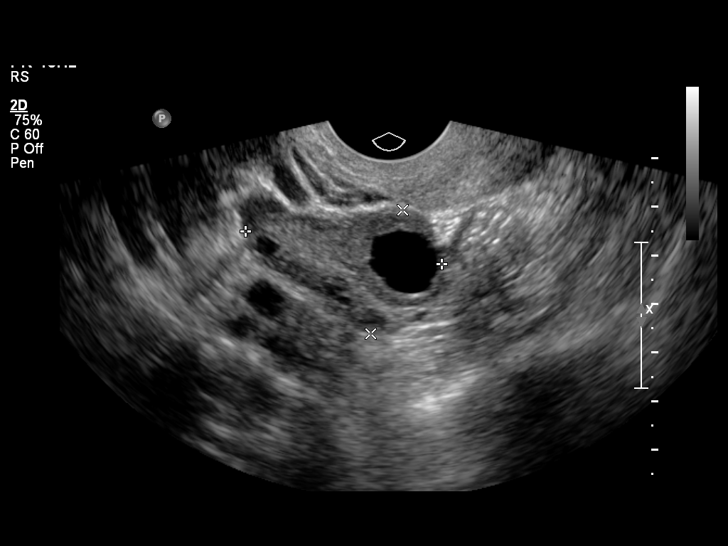
[im 16/21]
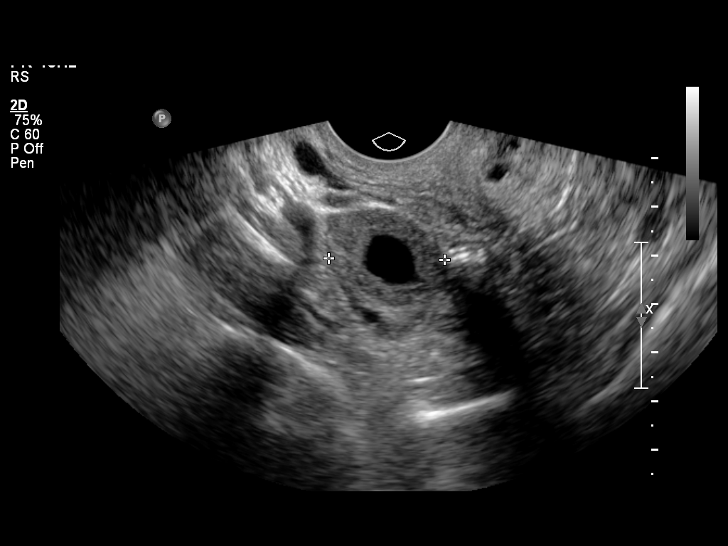
[im 18/21]
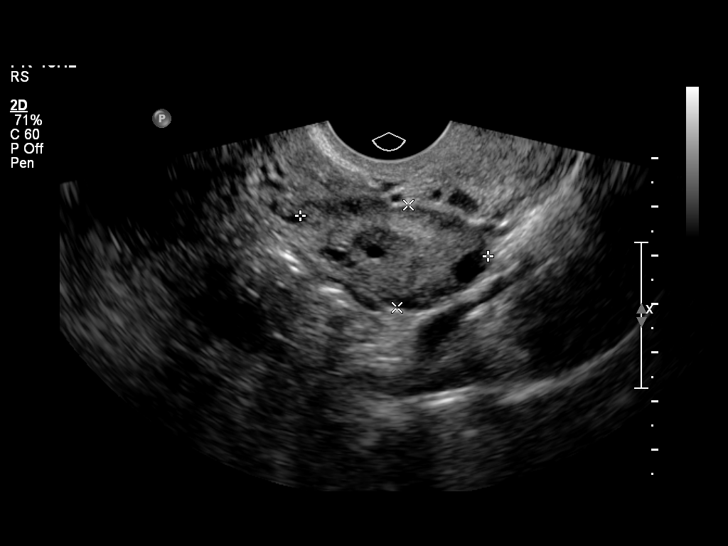
[im 19/21]
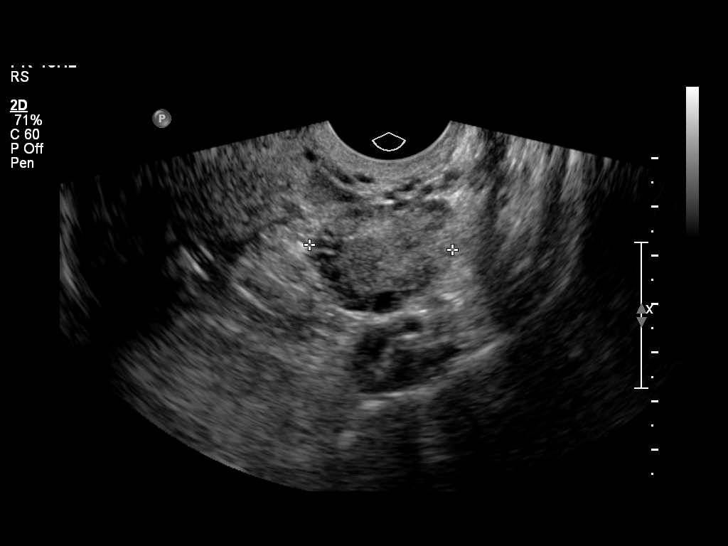
[im 21/21]
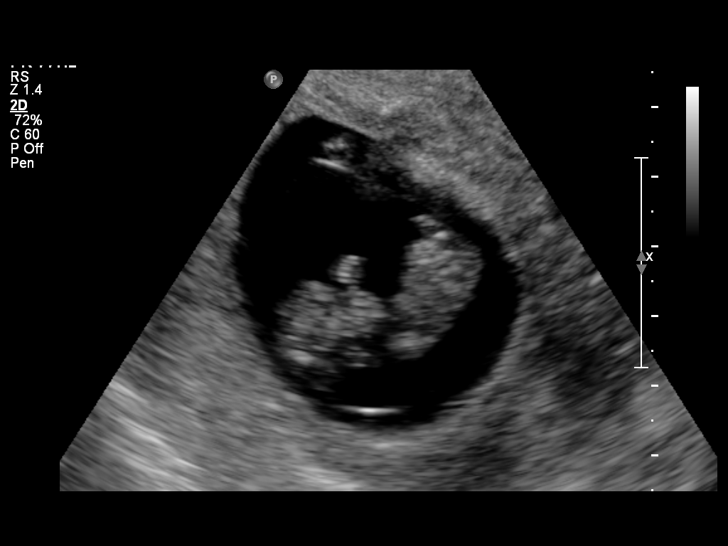

[14 of 21 positions shown; findings below may reference images not displayed]

Intrauterine gestational sac: Identified
Yolk sac: Identified
Embryo: Identified
Cardiac Activity: Identified
Heart Rate: 183

CRL: 37.9 mm 10   w  5   d          US EDC: 09/25/2012

Subchorionic hemorrhage: Small to moderate, measuring up to 4.4 x
1.4 cm.  Previously it measured the collection at up to 3.6 x
cm.  Therefore, allowing for differences in positioning, not
significantly changed.

Maternal uterus/adnexae:
Corpus luteal cyst noted on the right.  Otherwise, normal
sonographic appearance to the ovaries.

No free fluid.
IMPRESSION: Single intrauterine gestation with cardiac activity documented.
Estimated age of 10 weeks 5 days by crown-rump length.

Small to moderate subchorionic hemorrhage.

## 2014-07-07 ENCOUNTER — Encounter (HOSPITAL_COMMUNITY): Payer: Self-pay | Admitting: *Deleted

## 2014-09-11 IMAGING — US US OB DETAIL+14 WK
1 series · 12 of 28 positions shown · non-contrast
Comparison: none

[Series 1: us ob detail+14 wk · 0.35mm/px · 12 of 75 slices shown]
[im 3/75]
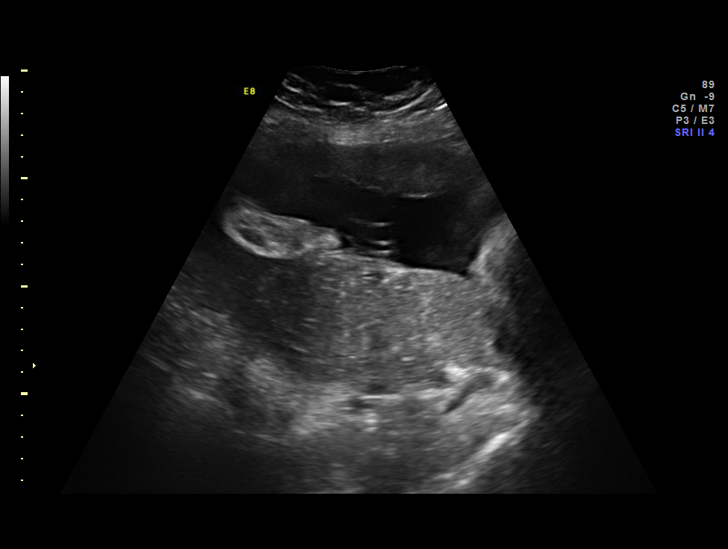
[im 9/75]
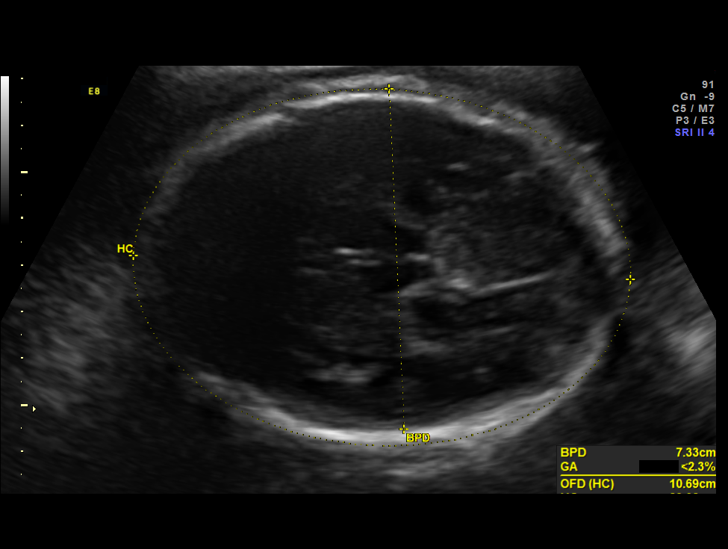
[im 14/75]
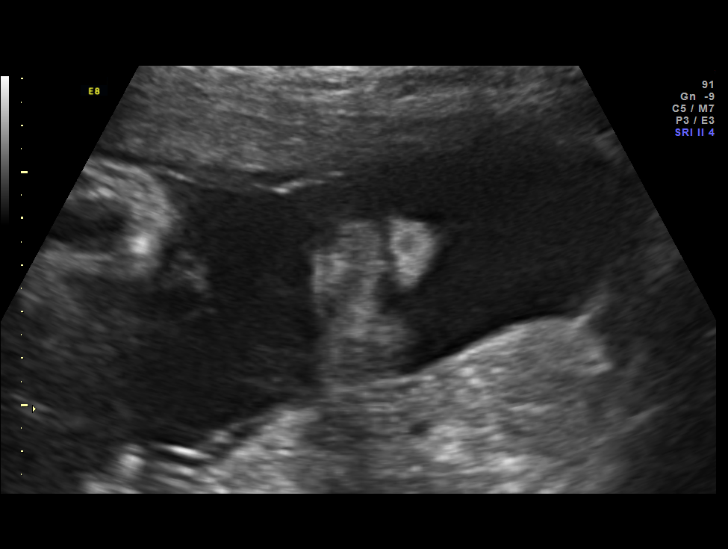
[im 22/75]
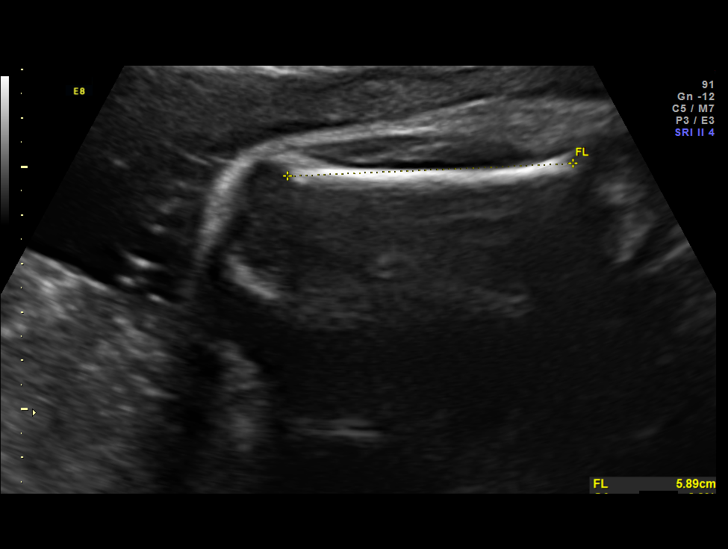
[im 28/75]
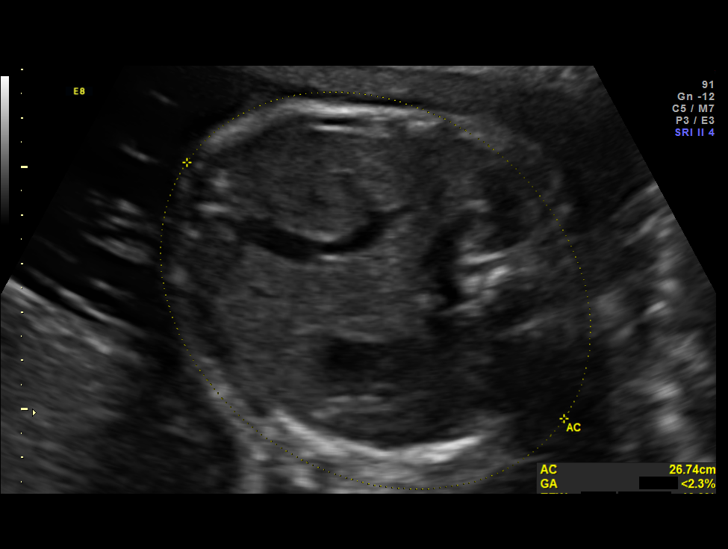
[im 33/75]
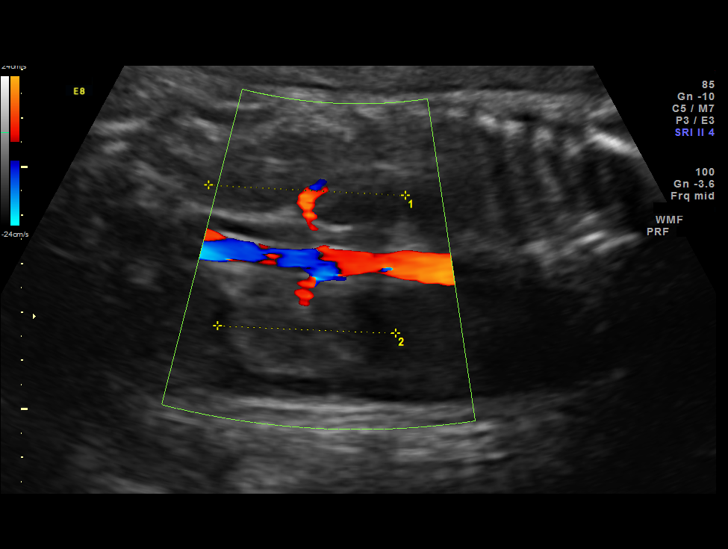
[im 42/75]
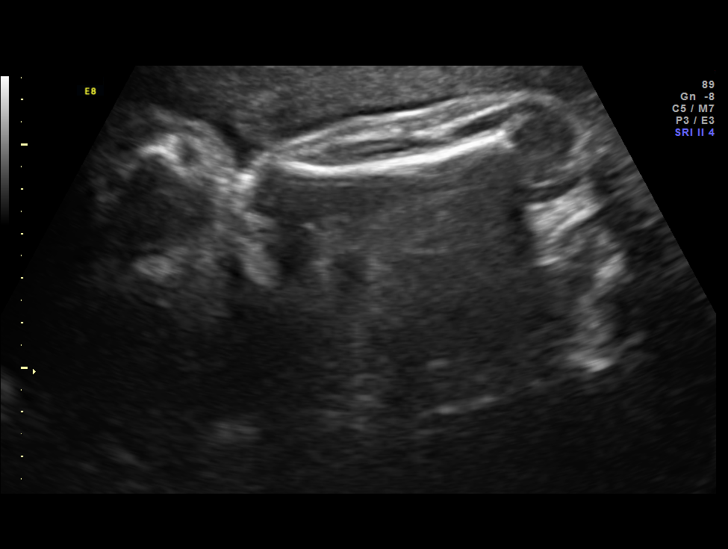
[im 47/75]
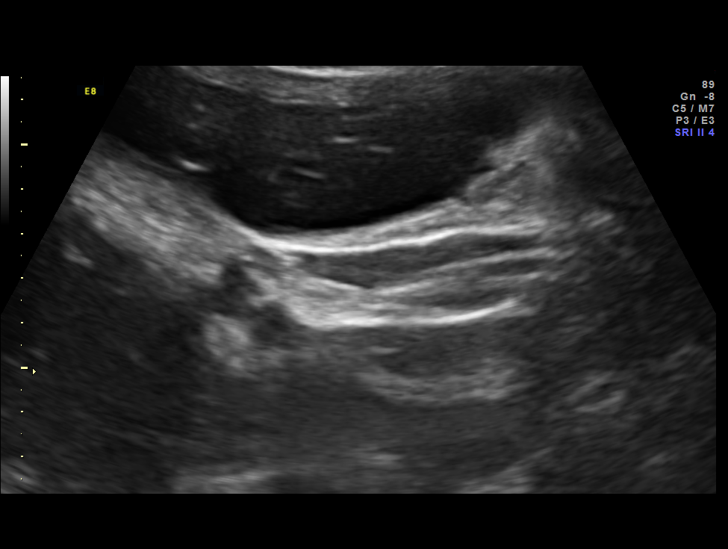
[im 53/75]
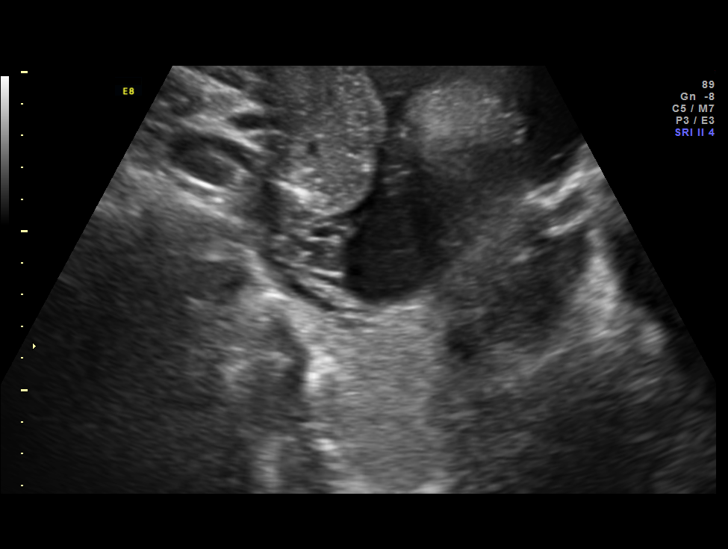
[im 61/75]
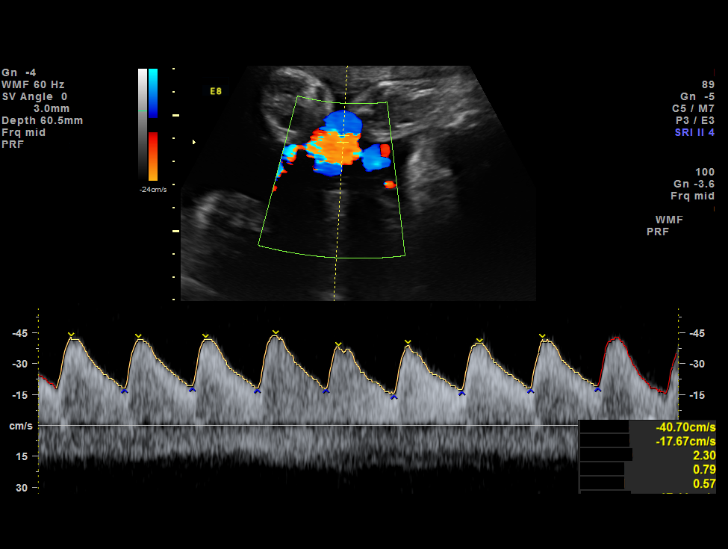
[im 66/75]
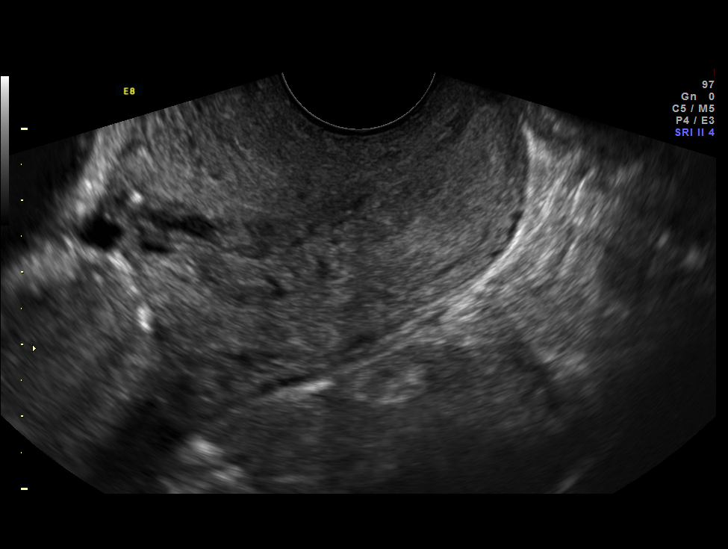
[im 72/75]
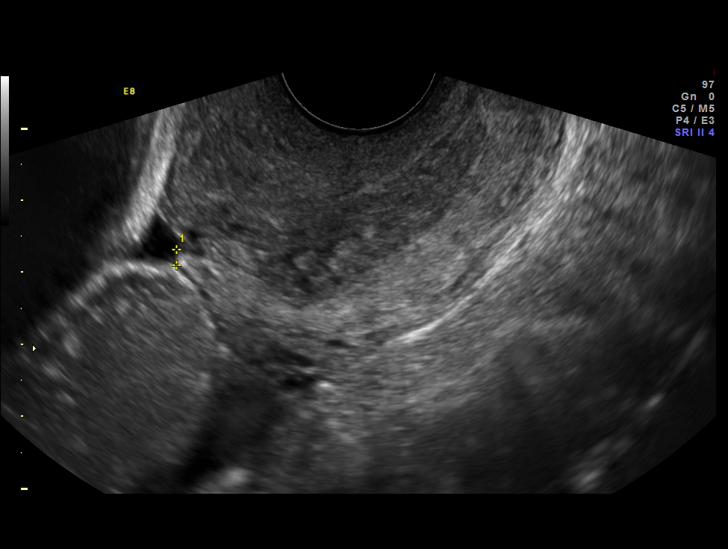

[12 of 28 positions shown; findings below may reference images not displayed]

OBSTETRICS REPORT
                      (Signed Final 08/17/2012 [DATE])

Service(s) Provided

 US OB DETAIL + 14 WK                                  76811.0
 US UA CORD DOPPLER                                    76820.0
 US OB TRANSVAGINAL                                    76817.0
Indications

 Placenta previa/Low lying: No bleeding
 Detailed fetal anatomic survey
 Size less than dates (Small for gestational [AGE]
 FGR)
 Previous cesarean section x 2
 Poor obstetric history: Previous preeclampsia /
 eclampsia/gestational HTN
 Anemia
 Poor obstetric history: Previous gestational
 diabetes
 Poor obstetric history: Previous preterm delivery
 Poor obstetric history: Previous macrosomia
Fetal Evaluation

 Num Of Fetuses:    1
 Fetal Heart Rate:  148                          bpm
 Cardiac Activity:  Observed
 Presentation:      Cephalic
 Placenta:          Posterior Marginal
                    Placenta Previa

 Amniotic Fluid
 AFI FV:      Subjectively within normal limits
 AFI Sum:     16.58   cm       60  %Tile     Larg Pckt:    6.29  cm
 RUQ:   6.29    cm   RLQ:    2.82   cm    LUQ:   2.18    cm   LLQ:    5.29   cm
Biophysical Evaluation

 Amniotic F.V:   Within normal limits       F. Tone:        Observed
 F. Movement:    Observed                   Score:          [DATE]
 F. Breathing:   Observed
Biometry

 BPD:     74.3  mm     G. Age:  29w 6d                CI:         70.6   70 - 86
 OFD:    105.3  mm                                    FL/HC:      20.3   19.4 -

 HC:     290.3  mm     G. Age:  32w 0d      < 3  %    HC/AC:      1.08   0.96 -

 AC:     268.1  mm     G. Age:  30w 6d      < 3  %    FL/BPD:     79.3   71 - 87
 FL:      58.9  mm     G. Age:  30w 5d      < 3  %    FL/AC:      22.0   20 - 24
 HUM:       52  mm     G. Age:  30w 2d      < 5  %

 Est. FW:    2252  gm    3 lb 10 oz    < 10  %
Gestational Age

 LMP:           34w 3d        Date:  12/20/11                 EDD:   09/25/12
 U/S Today:     30w 6d                                        EDD:   10/20/12
 Best:          34w 3d     Det. By:  LMP  (12/20/11)          EDD:   09/25/12
Anatomy

 Cranium:          Appears normal         Diaphragm:        Appears normal
 Fetal Cavum:      Appears normal         Stomach:          Appears normal, left
                                                            sided
 Ventricles:       Appears normal         Abdomen:          Appears normal
 Choroid Plexus:   Appears normal         Abdominal Wall:   Appears nml (cord
                                                            insert, abd wall)
 Cerebellum:       Not well visualized    Cord Vessels:     Appears normal (3
                                                            vessel cord)
 Posterior Fossa:  Not well visualized    Kidneys:          Appear normal
 Nuchal Fold:      Not well visualized    Bladder:          Appears normal
 Face:             Appears normal         Lower             Appears normal
                   (orbits and profile)   Extremities:
 Lips:             Appears normal         Upper             Not well visualized
                                          Extremities:

 Other:  Fetus appears to be a female.
Doppler - Fetal Vessels

 Umbilical Artery
 S/D:   2.28           36  %tile       RI:
 PI:    0.79                           PSV:       48.03   cm/s

Cervix Uterus Adnexa

 Cervix:       Not visualized (advanced GA >88wks)
Comments

 The patient's fetal anatomic survey is not complete due to
 advanced gestational age.  However, no gross fetal
 anomalies were identified.  Transvaginal ultrasound
 demonstrates a posterior marginal placenta previa.  No vasa
 previa is seen.
Impression

 Single living intrauterine pregnancy at 34 weeks 3 days.
 Posterior placenta previa.
 Fetal growth restriction (EFW <10%)
 Normal amniotic fluid volume.
 No gross fetal anomalies identified.
 BPP [DATE].
 Normal umbilical artery dopplers.
Recommendations

 Given the combination of fetal growth restriction and placenta
 previa I recommend Ms. Hagyo have a repeat LTCS between
 36 and 37 weeks of gestation.  I also recommend twice
 weekly fetal testing until delivery.

 questions or concerns.
                Erxleben, Ferienhaus

## 2014-09-15 ENCOUNTER — Encounter (HOSPITAL_COMMUNITY): Payer: Self-pay | Admitting: *Deleted

## 2014-09-18 ENCOUNTER — Encounter (HOSPITAL_COMMUNITY): Payer: Self-pay | Admitting: Obstetrics & Gynecology

## 2015-02-12 ENCOUNTER — Encounter (HOSPITAL_COMMUNITY): Payer: Self-pay | Admitting: Obstetrics & Gynecology

## 2017-02-03 DIAGNOSIS — Z124 Encounter for screening for malignant neoplasm of cervix: Secondary | ICD-10-CM

## 2017-02-03 HISTORY — DX: Encounter for screening for malignant neoplasm of cervix: Z12.4

## 2017-04-05 DIAGNOSIS — Z5189 Encounter for other specified aftercare: Secondary | ICD-10-CM

## 2017-04-05 HISTORY — DX: Encounter for other specified aftercare: Z51.89

## 2017-12-29 ENCOUNTER — Ambulatory Visit: Payer: Self-pay | Admitting: Obstetrics & Gynecology

## 2017-12-29 ENCOUNTER — Encounter: Payer: Self-pay | Admitting: Obstetrics & Gynecology

## 2017-12-29 NOTE — Progress Notes (Unsigned)
aub    Pap 2018 nl     Hgb/hct 6.7/27.9  3 transfusion last 2 years  Cycles every month 10 days ,passing clots /  Was on depo did not work.

## 2018-01-12 ENCOUNTER — Other Ambulatory Visit: Payer: Self-pay | Admitting: Obstetrics & Gynecology

## 2018-01-12 ENCOUNTER — Ambulatory Visit: Payer: Self-pay | Admitting: Obstetrics & Gynecology

## 2018-01-12 DIAGNOSIS — T8089XA Other complications following infusion, transfusion and therapeutic injection, initial encounter: Secondary | ICD-10-CM

## 2018-01-12 DIAGNOSIS — D649 Anemia, unspecified: Secondary | ICD-10-CM

## 2018-01-12 DIAGNOSIS — N939 Abnormal uterine and vaginal bleeding, unspecified: Secondary | ICD-10-CM

## 2018-01-12 HISTORY — DX: Anemia, unspecified: D64.9

## 2018-01-13 DIAGNOSIS — N939 Abnormal uterine and vaginal bleeding, unspecified: Secondary | ICD-10-CM | POA: Insufficient documentation

## 2018-01-13 LAB — CBC AND DIFFERENTIAL
Baso(Absolute): 0.1 10*3/uL (ref 0.0–0.2)
Basos: 1 %
Eos: 2 %
Eosinophils Absolute: 0.1 10*3/uL (ref 0.0–0.4)
Hematocrit: 25.8 % — ABNORMAL LOW (ref 34.0–46.6)
Hemoglobin: 6.7 g/dL — CL (ref 11.1–15.9)
Immature Granulocytes Absolute: 0 10*3/uL (ref 0.0–0.1)
Immature Granulocytes: 0 %
Lymphocytes Absolute: 1.7 10*3/uL (ref 0.7–3.1)
Lymphocytes: 30 %
MCH: 13.9 pg — ABNORMAL LOW (ref 26.6–33.0)
MCHC: 26 g/dL — ABNORMAL LOW (ref 31.5–35.7)
MCV: 53 fL — ABNORMAL LOW (ref 79–97)
Monocytes Absolute: 0.4 10*3/uL (ref 0.1–0.9)
Monocytes: 7 %
Neutrophils Absolute: 3.4 10*3/uL (ref 1.4–7.0)
Neutrophils: 60 %
Platelets: 571 10*3/uL — ABNORMAL HIGH (ref 150–379)
RBC: 4.83 x10E6/uL (ref 3.77–5.28)
RDW: 21.8 % — ABNORMAL HIGH (ref 12.3–15.4)
WBC: 5.7 10*3/uL (ref 3.4–10.8)

## 2018-01-13 NOTE — Progress Notes (Addendum)
Patient with heavy bleeding anemia.  Patient also is plan of blood transfusion and hematology consult.  She is opting for endometria ablation ,discussed endometrial biopsy and tubal sterilization.she has been on hormone not working,  Endometrial ablation ,all risks and benefits reviewed.  Endometrial Biopsy Procedure Note    Pre-operative Diagnosis: heavy bleeding    Post-operative Diagnosis: same    Indications: abnormal uterine bleeding    Procedure Details    Urine pregnancy test was done  and result was negative.  The risks (including infection, bleeding, pain, and uterine perforation) and benefits of the procedure were explained to the patient and Written informed consent was obtained.  Antibiotic prophylaxis against endocarditis was not indicated.     The patient was placed in the dorsal lithotomy position.  Bimanual exam showed the uterus to be in the neutral position.  A Graves' speculum inserted in the vagina, and the cervix prepped with povidone iodine.     A sharp tenaculum was applied to the anterior lip of the cervix for stabilization.  A sterile uterine sound was used to sound the uterus to a depth of 8cm.  A Pipelle endometrial aspirator was used to sample the endometrium.  Sample was sent for pathologic examination.    Condition:  Stable    Complications:  None    Plan:    The patient was advised to call for any fever or for prolonged or severe pain or bleeding. She was advised to use NSAID as needed for mild to moderate pain. She was advised to avoid vaginal intercourse for 48 hours or until the bleeding has completely stopped.    Attending Physician Documentation:  I was present for or participated in the entire procedure, including opening and closing.

## 2018-01-17 ENCOUNTER — Telehealth: Payer: Self-pay | Admitting: Obstetrics & Gynecology

## 2018-01-17 ENCOUNTER — Telehealth: Payer: PRIVATE HEALTH INSURANCE

## 2018-01-17 ENCOUNTER — Encounter (HOSPITAL_BASED_OUTPATIENT_CLINIC_OR_DEPARTMENT_OTHER): Payer: Self-pay

## 2018-01-17 LAB — TISSUE EXAM

## 2018-01-17 NOTE — Pre-Procedure Instructions (Signed)
   PSS int not done; unable to reach pt after multiple attempts.

## 2018-01-17 NOTE — Pre-Procedure Instructions (Signed)
   cbc done 01/12/18- H/H low. RBC transfusion 01/18/18 per surgeon's order at [facility] pt infusion center.    Pt not sure how many units will be transfused. No post transfusion cbc ordered.   CBC order entered for DOS.    No other testing ordered or required per guidelines.

## 2018-01-18 ENCOUNTER — Encounter: Payer: Self-pay | Admitting: Obstetrics & Gynecology

## 2018-01-18 ENCOUNTER — Encounter (HOSPITAL_BASED_OUTPATIENT_CLINIC_OR_DEPARTMENT_OTHER): Payer: Self-pay

## 2018-01-18 NOTE — Pre-Procedure Instructions (Signed)
   Pt currently at [facility] infusion center, scheduled for 2 units PRBC.   Left message @ surgeon's office to call [facility], want to inform surgeon of above and see if she is OK to proceed without hematology consult (mentioned in Progress Note 01/12/18)

## 2018-01-19 ENCOUNTER — Encounter (HOSPITAL_BASED_OUTPATIENT_CLINIC_OR_DEPARTMENT_OTHER)
Admission: RE | Disposition: A | Discharge status: Home / Self Care | Payer: Self-pay | Source: Ambulatory Visit | Attending: Obstetrics & Gynecology

## 2018-01-19 ENCOUNTER — Ambulatory Visit (HOSPITAL_BASED_OUTPATIENT_CLINIC_OR_DEPARTMENT_OTHER): Payer: PRIVATE HEALTH INSURANCE | Admitting: Certified Registered"

## 2018-01-19 ENCOUNTER — Encounter (HOSPITAL_BASED_OUTPATIENT_CLINIC_OR_DEPARTMENT_OTHER): Payer: Self-pay

## 2018-01-19 ENCOUNTER — Ambulatory Visit
Admission: RE | Admit: 2018-01-19 | Discharge: 2018-01-19 | Disposition: A | Discharge status: Home / Self Care | Payer: PRIVATE HEALTH INSURANCE | Source: Ambulatory Visit | Attending: Obstetrics & Gynecology | Admitting: Obstetrics & Gynecology

## 2018-01-19 ENCOUNTER — Ambulatory Visit: Payer: Self-pay

## 2018-01-19 DIAGNOSIS — N939 Abnormal uterine and vaginal bleeding, unspecified: Secondary | ICD-10-CM

## 2018-01-19 DIAGNOSIS — F17210 Nicotine dependence, cigarettes, uncomplicated: Secondary | ICD-10-CM | POA: Insufficient documentation

## 2018-01-19 DIAGNOSIS — Z302 Encounter for sterilization: Secondary | ICD-10-CM | POA: Insufficient documentation

## 2018-01-19 DIAGNOSIS — N920 Excessive and frequent menstruation with regular cycle: Secondary | ICD-10-CM | POA: Insufficient documentation

## 2018-01-19 DIAGNOSIS — D649 Anemia, unspecified: Secondary | ICD-10-CM | POA: Insufficient documentation

## 2018-01-19 LAB — CBC
Absolute NRBC: 0 10*3/uL (ref 0.00–0.00)
Hematocrit: 31.6 % — ABNORMAL LOW (ref 34.7–43.7)
Hgb: 8.4 g/dL — ABNORMAL LOW (ref 11.4–14.8)
MCH: 16 pg — ABNORMAL LOW (ref 25.1–33.5)
MCHC: 26.6 g/dL — ABNORMAL LOW (ref 31.5–35.8)
MCV: 60.1 fL — ABNORMAL LOW (ref 78.0–96.0)
Nucleated RBC: 0 /100 WBC (ref 0.0–0.0)
Platelets: 420 10*3/uL — ABNORMAL HIGH (ref 142–346)
RBC: 5.26 10*6/uL — ABNORMAL HIGH (ref 3.90–5.10)
RDW: 29 % — ABNORMAL HIGH (ref 11–15)
WBC: 6.74 10*3/uL (ref 3.10–9.50)

## 2018-01-19 LAB — POCT PREGNANCY TEST, URINE HCG: POCT Pregnancy HCG Test, UR: NEGATIVE

## 2018-01-19 SURGERY — HYSTEROSCOPY, WITH ENDOMETRIAL ABLATION
Anesthesia: Anesthesia General | Site: Pelvis | Laterality: Right | Wound class: Clean Contaminated

## 2018-01-19 MED ORDER — NEOSTIGMINE METHYLSULFATE 1 MG/ML IJ/IV SOLN (WRAP)
Status: DC | PRN
Start: 2018-01-19 — End: 2018-01-19
  Administered 2018-01-19: 4 mg via INTRAVENOUS

## 2018-01-19 MED ORDER — FAMOTIDINE 20 MG/2ML IV SOLN
INTRAVENOUS | Status: AC
Start: 2018-01-19 — End: ?
  Filled 2018-01-19: qty 2

## 2018-01-19 MED ORDER — FENTANYL CITRATE (PF) 50 MCG/ML IJ SOLN (WRAP)
INTRAMUSCULAR | Status: AC
Start: 2018-01-19 — End: ?
  Filled 2018-01-19: qty 2

## 2018-01-19 MED ORDER — OXYCODONE HCL 5 MG PO TABS
5.0000 mg | ORAL_TABLET | Freq: Once | ORAL | Status: AC | PRN
Start: 2018-01-19 — End: 2018-01-19
  Administered 2018-01-19: 5 mg via ORAL
  Filled 2018-01-19: qty 1

## 2018-01-19 MED ORDER — PROPOFOL 10 MG/ML IV EMUL (WRAP)
INTRAVENOUS | Status: DC | PRN
Start: 2018-01-19 — End: 2018-01-19
  Administered 2018-01-19: 200 mg via INTRAVENOUS

## 2018-01-19 MED ORDER — ESMOLOL HCL 100 MG/10ML IV SOLN
INTRAVENOUS | Status: AC
Start: 2018-01-19 — End: ?
  Filled 2018-01-19: qty 10

## 2018-01-19 MED ORDER — FENTANYL CITRATE (PF) 50 MCG/ML IJ SOLN (WRAP)
25.0000 ug | INTRAMUSCULAR | Status: DC | PRN
Start: 2018-01-19 — End: 2018-01-19
  Filled 2018-01-19: qty 2

## 2018-01-19 MED ORDER — ACETAMINOPHEN 500 MG PO TABS
ORAL_TABLET | ORAL | Status: AC
Start: 2018-01-19 — End: ?
  Filled 2018-01-19: qty 2

## 2018-01-19 MED ORDER — ONDANSETRON HCL 4 MG/2ML IJ SOLN
INTRAMUSCULAR | Status: DC | PRN
Start: 2018-01-19 — End: 2018-01-19
  Administered 2018-01-19: 4 mg via INTRAVENOUS

## 2018-01-19 MED ORDER — ROCURONIUM BROMIDE 50 MG/5ML IV SOLN
INTRAVENOUS | Status: AC
Start: 2018-01-19 — End: ?
  Filled 2018-01-19: qty 5

## 2018-01-19 MED ORDER — MIDAZOLAM HCL 2 MG/2ML IJ SOLN
INTRAMUSCULAR | Status: AC
Start: 2018-01-19 — End: ?
  Filled 2018-01-19: qty 2

## 2018-01-19 MED ORDER — PROPOFOL 10 MG/ML IV EMUL (WRAP)
INTRAVENOUS | Status: AC
Start: 2018-01-19 — End: ?
  Filled 2018-01-19: qty 20

## 2018-01-19 MED ORDER — METOCLOPRAMIDE HCL 5 MG/ML IJ SOLN
5.0000 mg | INTRAMUSCULAR | Status: DC | PRN
Start: 2018-01-19 — End: 2018-01-19
  Filled 2018-01-19: qty 1

## 2018-01-19 MED ORDER — ONDANSETRON HCL 4 MG/2ML IJ SOLN
4.0000 mg | Freq: Once | INTRAMUSCULAR | Status: DC | PRN
Start: 2018-01-19 — End: 2018-01-19
  Filled 2018-01-19: qty 2

## 2018-01-19 MED ORDER — BUPIVACAINE-EPINEPHRINE (PF) 0.25% -1:200000 IJ SOLN
INTRAMUSCULAR | Status: DC | PRN
Start: 2018-01-19 — End: 2018-01-19
  Administered 2018-01-19: 16 mL via INTRAMUSCULAR

## 2018-01-19 MED ORDER — IBUPROFEN 600 MG PO TABS
600.00 mg | ORAL_TABLET | Freq: Four times a day (QID) | ORAL | 0 refills | Status: DC | PRN
Start: 2018-01-19 — End: 2019-07-29

## 2018-01-19 MED ORDER — BUPIVACAINE-EPINEPHRINE (PF) 0.25% -1:200000 IJ SOLN
INTRAMUSCULAR | Status: AC
Start: 2018-01-19 — End: ?
  Filled 2018-01-19: qty 30

## 2018-01-19 MED ORDER — LACTATED RINGERS IV SOLN
INTRAVENOUS | Status: DC
Start: 2018-01-19 — End: 2018-01-19

## 2018-01-19 MED ORDER — PROPOFOL 10 MG/ML IV EMUL (WRAP)
INTRAVENOUS | Status: AC
Start: 2018-01-19 — End: ?
  Filled 2018-01-19: qty 50

## 2018-01-19 MED ORDER — SODIUM CHLORIDE 0.9 % IR SOLN
Status: DC | PRN
Start: 2018-01-19 — End: 2018-01-19
  Administered 2018-01-19: 200 mL
  Administered 2018-01-19: 1000 mL

## 2018-01-19 MED ORDER — LIDOCAINE HCL (PF) 2 % IJ SOLN
INTRAMUSCULAR | Status: AC
Start: 2018-01-19 — End: ?
  Filled 2018-01-19: qty 5

## 2018-01-19 MED ORDER — GLYCOPYRROLATE 0.2 MG/ML IJ SOLN
INTRAMUSCULAR | Status: DC | PRN
Start: 2018-01-19 — End: 2018-01-19
  Administered 2018-01-19: .5 mg via INTRAVENOUS

## 2018-01-19 MED ORDER — STERILE WATER FOR IRRIGATION IR SOLN
Status: DC | PRN
Start: 2018-01-19 — End: 2018-01-19
  Administered 2018-01-19: 1000 mL

## 2018-01-19 MED ORDER — ROCURONIUM BROMIDE 50 MG/5ML IV SOLN
INTRAVENOUS | Status: DC | PRN
Start: 2018-01-19 — End: 2018-01-19
  Administered 2018-01-19: 50 mg via INTRAVENOUS

## 2018-01-19 MED ORDER — MIDAZOLAM HCL 2 MG/2ML IJ SOLN
INTRAMUSCULAR | Status: DC | PRN
Start: 2018-01-19 — End: 2018-01-19
  Administered 2018-01-19: 2 mg via INTRAVENOUS

## 2018-01-19 MED ORDER — FAMOTIDINE 10 MG/ML IV SOLN (WRAP)
INTRAVENOUS | Status: DC | PRN
Start: 2018-01-19 — End: 2018-01-19
  Administered 2018-01-19: 20 mg via INTRAVENOUS

## 2018-01-19 MED ORDER — FENTANYL CITRATE (PF) 50 MCG/ML IJ SOLN (WRAP)
INTRAMUSCULAR | Status: DC | PRN
Start: 2018-01-19 — End: 2018-01-19
  Administered 2018-01-19 (×2): 25 ug via INTRAVENOUS
  Administered 2018-01-19: 100 ug via INTRAVENOUS

## 2018-01-19 MED ORDER — ESMOLOL HCL 100 MG/10ML IV SOLN
INTRAVENOUS | Status: DC | PRN
Start: 2018-01-19 — End: 2018-01-19
  Administered 2018-01-19 (×2): 20 mg via INTRAVENOUS
  Administered 2018-01-19: 30 mg via INTRAVENOUS

## 2018-01-19 MED ORDER — GLYCOPYRROLATE 0.2 MG/ML IJ SOLN
INTRAMUSCULAR | Status: AC
Start: 2018-01-19 — End: ?
  Filled 2018-01-19: qty 1

## 2018-01-19 MED ORDER — PHENYLEPHRINE 100 MCG/ML IN NACL 0.9% IV SOSY
PREFILLED_SYRINGE | INTRAVENOUS | Status: DC | PRN
Start: 2018-01-19 — End: 2018-01-19
  Administered 2018-01-19 (×2): 150 ug via INTRAVENOUS

## 2018-01-19 MED ORDER — PHENYLEPHRINE 100 MCG/ML IN NACL 0.9% IV SOSY
PREFILLED_SYRINGE | INTRAVENOUS | Status: AC
Start: 2018-01-19 — End: ?
  Filled 2018-01-19: qty 5

## 2018-01-19 MED ORDER — GLYCOPYRROLATE 0.2 MG/ML IJ SOLN
INTRAMUSCULAR | Status: AC
Start: 2018-01-19 — End: ?
  Filled 2018-01-19: qty 2

## 2018-01-19 MED ORDER — HYDROCODONE-ACETAMINOPHEN 5-300 MG PO TABS
5.0000 mg | ORAL_TABLET | ORAL | 0 refills | Status: AC | PRN
Start: 2018-01-19 — End: 2018-01-26

## 2018-01-19 MED ORDER — LACTATED RINGERS IV BOLUS
500.0000 mL | Freq: Once | INTRAVENOUS | Status: DC | PRN
Start: 2018-01-19 — End: 2018-01-19

## 2018-01-19 MED ORDER — FENTANYL CITRATE (PF) 50 MCG/ML IJ SOLN (WRAP)
50.0000 ug | INTRAMUSCULAR | Status: AC | PRN
Start: 2018-01-19 — End: 2018-01-19
  Administered 2018-01-19 (×2): 50 ug via INTRAVENOUS

## 2018-01-19 MED ORDER — OXYCODONE HCL 5 MG PO TABS
ORAL_TABLET | ORAL | Status: AC
Start: 2018-01-19 — End: ?
  Filled 2018-01-19: qty 1

## 2018-01-19 MED ORDER — PROPOFOL INFUSION 10 MG/ML
INTRAVENOUS | Status: DC | PRN
Start: 2018-01-19 — End: 2018-01-19
  Administered 2018-01-19: 30 ug/kg/min via INTRAVENOUS

## 2018-01-19 MED ORDER — ONDANSETRON HCL 4 MG/2ML IJ SOLN
INTRAMUSCULAR | Status: AC
Start: 2018-01-19 — End: ?
  Filled 2018-01-19: qty 2

## 2018-01-19 MED ORDER — NEOSTIGMINE METHYLSULFATE 1 MG/ML IJ/IV SOLN (WRAP)
Status: AC
Start: 2018-01-19 — End: ?
  Filled 2018-01-19: qty 5

## 2018-01-19 MED ORDER — MEPERIDINE HCL 25 MG/ML IJ SOLN
12.5000 mg | Freq: Once | INTRAMUSCULAR | Status: DC | PRN
Start: 2018-01-19 — End: 2018-01-19
  Filled 2018-01-19: qty 1

## 2018-01-19 MED ORDER — LIDOCAINE HCL 2 % IJ SOLN
INTRAMUSCULAR | Status: DC | PRN
Start: 2018-01-19 — End: 2018-01-19
  Administered 2018-01-19: 100 mg via INTRAVENOUS

## 2018-01-19 SURGICAL SUPPLY — 67 items
ADHESIVE SKIN CLOSURE DERMABOND ADVANCED (Skin Closure) ×3
ADHESIVE SKIN CLOSURE DERMABOND ADVANCED .7 ML LIQUID APPLICATOR (Skin Closure) IMPLANT
ADHESIVE SKIN CLOSURE DERMABOND MINI .36 (Suture) ×3
ADHESIVE SKIN CLOSURE DERMABOND MINI .36 ML LIQUID APPLICATOR (Suture) ×3 IMPLANT
ADHESIVE SKNCLS 2 OCTYL CYNCRLT .36ML MN (Suture) ×1
ADHESIVE SKNCLS 2 OCTYL CYNCRLT .7ML (Skin Closure) ×1
APPLCATOR CHLORAPREP 26ML (Prep) ×4 IMPLANT
BAND AID STERILE 1X3 (Dressing) ×1 IMPLANT
CANNISTER SUCTION 3000CC (Suction) ×4 IMPLANT
CANULA STABILITY 5MM (Procedure Accessories) IMPLANT
CATHETER URETHRAL OD14 FR STAGGER DRAINAGE EYE ROUND CLOSED TIP (Catheter Urine) IMPLANT
CATHETER URTH PVC DOV RBNL 14FR 16IN LF (Catheter Urine)
ELECTRODE ADULT PATIENT RETURN L9 FT REM POLYHESIVE ACRYLIC FOAM (Procedure Accessories) ×3 IMPLANT
ELECTRODE PATIENT RETURN L9 FT VALLEYLAB (Procedure Accessories) ×3
ELECTRODE PT RTN RM PHSV ACRL FM C30- LB (Procedure Accessories) ×1
GLOVE SRG NTR RBR 7 BGL IND 288X91MM LTX (Glove) ×1
GLOVE SURG BIOGEL INDIC SZ 6.5 (Glove) ×4 IMPLANT
GLOVE SURG BIOGEL PF LTX SZ6.0 (Glove) ×4 IMPLANT
GLOVE SURG BIOGEL SZ6.5 (Glove) ×8 IMPLANT
GLOVE SURGICAL 7 BIOGEL INDICATOR POWDER (Glove) ×3
GLOVE SURGICAL 7 BIOGEL INDICATOR POWDER FREE SMOOTH BEAD CUFF (Glove) ×3 IMPLANT
GOWN SRG XL SMARTGOWN LF STRL LVL 4 (Gown)
GOWN SURGICAL XL SMARTGOWN LEVEL 4 (Gown)
GOWN SURGICAL XL SMARTGOWN LEVEL 4 BREATHABLE (Gown) IMPLANT
IRRIGATOR SUCTION ERGONOMIC HAND PIECE STRYKEFLOW II (Suction) IMPLANT
IRRIGATOR SUCTION STRYKEFLOW 2 (Suction)
KIT NOVASURE ABLATION RF (Disposable Instruments) ×4 IMPLANT
MANIPULATOR UTERINE OD4.5 MM SURGICAL (Procedure Accessories) ×3
MANIPULATOR UTERINE OD4.5 MM SURGICAL ZUMI-ZINNATI ERGONOMICAL CURVE (Procedure Accessories) ×3 IMPLANT
NEEDLE INJ SFTY 22GX1.5IN (Needles) ×4 IMPLANT
NEEDLE INSUFFLATION 120MM (Needles) ×4 IMPLANT
NEEDLE INSUFFLATION 150MM (Needles) IMPLANT
NEEDLE REG BEVEL 19GX1.5IN (Needles) ×4 IMPLANT
PACK GEN LAPAROSCOPY FFX (Pack) ×4 IMPLANT
PACK LITHOTOMY MINOR (Pack) ×4 IMPLANT
PAD SANITARY L12.25 IN X W4.25 IN HEAVY ABSORBENT MOISTURE BARRIER (Dressing) IMPLANT
PAD SNTR SLK FLF CRTY 12.25X4.25IN LF NS (Dressing) ×4
SEALER/DIVIDER LAPAROSCOPIC L37 CM 180 D (Procedure Accessories) ×3
SEALER/DIVIDER LAPAROSCOPIC L37 CM 180 D L17.8 MM 2 ACTION JAW BLUNT (Procedure Accessories) IMPLANT
SEALER/DIVIDER LAPSCP 180D 17.8MM LGSR 5 (Procedure Accessories) ×1
SET IRR DEHP 10 GTT/ML STRG 81IN LF STRL (Tubing) ×1
SET IRRIGATION L81 IN 10 GTT/ML STRAIGHT (Tubing) ×3
SET IRRIGATION L81 IN 10 GTT/ML STRAIGHT NA DEHP BLADDER REGULATE (Tubing) ×3 IMPLANT
SOLUTION IRR 0.9% NACL 1000ML LF STRL (Irrigation Solutions) ×1
SOLUTION IRRIGATION 0.9% SODIUM CHLORIDE (Irrigation Solutions) ×3
SOLUTION IRRIGATION 0.9% SODIUM CHLORIDE 1000 ML PLASTIC POUR BOTTLE (Irrigation Solutions) ×3 IMPLANT
SOLUTION IV 0.9% NACL 1000ML VFLX LF PLS (IV Solutions) ×1
SOLUTION IV 0.9% SODIUM CHLORIDE PVC (IV Solutions) ×3
SOLUTION IV 0.9% SODIUM CHLORIDE PVC 1000 ML PH 5 PLASTIC CONTAINER (IV Solutions) ×3 IMPLANT
STRIP SKIN CLOSURE L4 IN X W1/2 IN (Dressing) ×3
STRIP SKIN CLOSURE L4 IN X W1/2 IN REINFORCE STERI-STRIP POLYESTER (Dressing) ×3 IMPLANT
STRIP SKNCLS PLSTR STRSTRP 4X.5IN LF (Dressing) ×1
SUTURE MONOCRYL 4-0 PS2 27IN (Suture) ×8 IMPLANT
SUTURE VICRYL 0 UR6 27IN (Suture) ×4 IMPLANT
SYSTEM IMAGING 8X6IN CLEARIFY MICROFIBER WARM HUB TRCR WIPE DSPSBL (Kits) ×3 IMPLANT
SYSTEM IMG MRFBR CLEARIFY 8X6IN WRM HUB (Kits) ×4
TOWEL STERILE REUSABLE 8PK (Procedure Accessories) ×4 IMPLANT
TROCAR BLADELES 8X100MM (Laparoscopy Supplies) ×4 IMPLANT
TROCAR BLADELESS ENDO 5X100MM (Laparoscopy Supplies) ×4 IMPLANT
TUBE SET DISP HIGH FLOW (Tubing) ×1
TUBING INSUFFLATION SET HIGH FLOW (Tubing) ×3
TUBING INSUFFLATION SET HIGH FLOW TOUCHSCREEN PNEUMOSURE THERMOPLASTIC (Tubing) ×3 IMPLANT
TUBING SCT IRR (Suction)
TUBING SCT MDVC MXGR BBL 3/16IN 100FT LF (Tubing) ×1
TUBING SUCTION ID3/16 IN L100 FT MALE TO (Tubing) ×3
TUBING SUCTION ID3/16 IN L100 FT MALE TO MALE CONNECTOR MEDI-VAC (Tubing) ×3 IMPLANT
UTERINE MANIPULATOR (Procedure Accessories) ×1

## 2018-01-19 NOTE — H&P (Signed)
Date/Time: 01/19/18, 4:14 PM  Patient Name: Caitlin Morse  Attending Physician: Elnoria Howard, Markham Jordan    CC: Patient is a 37 y.o. female presenting to [facility] for scheduled surgery.   Patient is having heavy periods and blood transfusion ,opting for hysteroscopy d&C endometrial ablation and tubal sterilization ,  S/p blood transfusion 01/18/18      ROS:   Denies fever/chills, HA, visual changes, lightheadedness, dizziness, SOB, CP, palpitations, N/V, diarrhea, constipation, dysuria, hematuria.    Past Medical History:     Past Medical History:   Diagnosis Date   . Anemia 01/12/2018    Hgb 6.7   . Anxiety    . Depression     Not on a medication right now.    . Encounter for blood transfusion 04/2017    01/18/18 [facility] - transfusing 2 units PRBC   . Pap smear for cervical cancer screening 02/2017    normal        Past Surgical History:     Past Surgical History:   Procedure Laterality Date   . CESAREAN SECTION      X 4   . lab ben         Medications:     Current Discharge Medication List      CONTINUE these medications which have NOT CHANGED    Details   diphenhydrAMINE (BENADRYL) 25 MG tablet Take 25 mg by mouth nightly as needed      ibuprofen (ADVIL,MOTRIN) 200 MG tablet Take 400 mg by mouth as needed for Pain              Allergies:     No Known Allergies    Social History:     Social History     Social History   . Marital status: Married     Spouse name: N/A   . Number of children: N/A   . Years of education: N/A     Occupational History   . Not on file.     Social History Main Topics   . Smoking status: Current Every Day Smoker     Packs/day: 1.00     Years: 2.00   . Smokeless tobacco: Never Used      Comment: Hasn't had a smoke in 2 days (01/15/18)   . Alcohol use 0.6 oz/week     1 Glasses of wine per week      Comment: for very special occassion    . Drug use: No   . Sexual activity: Yes     Partners: Male     Birth control/ protection: None     Other Topics Concern   . Not on file     Social History  Narrative   . No narrative on file       Family History:     Family History   Problem Relation Age of Onset   . Diabetes Father    . Hypertension Father        Physical Exam:   (Per outpatient records    BP 120/76   Pulse 69   Temp 98 F (36.7 C) (Oral)   Resp 15   Ht 1.651 m (5\' 5" )   Wt 101.2 kg (223 lb)   LMP 01/08/2018   SpO2 100%   BMI 37.11 kg/m    Gen: NAD  CV: RRR  Pulm: CTAB  Pelvic exam deferred to OR    Labs & Imaging:   Pre-op labs:  H/H 8.4/31.6  Assessment & Plan:   37 y.o. female is here for endometrial ablation and tubal ligation.  - Proceed to OR  - Diet: NPO  - ERAS protocol  - DVT prophylaxis: SCDs   - Consent signed    Smitty Cords Abyaneh,MD        Please note that this H&P will be addended on day of surgery by surgical attending.

## 2018-01-19 NOTE — Discharge Instructions (Signed)
Anesthesia Discharge Instructions: After Your Surgery  You've just had surgery. During surgery you were given medicine called anesthesia to keep you relaxed and comfortable. After surgery you may have some pain or nausea. This is common. Your doctor or nurse will show you how to take care of yourself when you go home. He or she will also answer your questions. Here are some tips for feeling better and getting well after surgery.        For the first 24 hours after your surgery:   Do not drive or use heavy equipment.    Do not make important decisions or sign legal papers.   Do not drink alcohol.   Have someone stay with you, if needed. He or she can watch for problems and help keep you safe.   Have an adult family member or friend drive you home.    Managing pain  If you have pain after surgery, pain medicine will help you feel better. Take it as ordered by your surgeon, before pain becomes severe. Also, ask your doctor about other ways to control pain. This might be with rest, ice, repositioning, and elevation. Follow any other instructions your surgeon or nurse gives you.    Tips for taking pain medicine:    Stay on schedule with your medication   Most pain relievers taken by mouth need at least 20 to 30 minutes to start to work.   Taking medicine on a schedule can help you remember to take it. Try to time your medicine so that you can take it before starting an activity. This might be before you get dressed, go for a walk, or sit down for dinner.    Common side effects of prescription pain medications   Pain medicines can cause nausea. Eat a little food before taking pain medicine to avoid this.   Constipation is a common side effect of pain medicines. Drinking lots of fluids and eating foods, such as fruits and vegetables, that are high in fiber can also help.    Call your doctor before taking any medicines such as laxatives or stool softeners to help ease constipation. Also, ask if you should avoid  any foods. Remember, do not take laxatives unless your surgeon has prescribed them   Drinking alcohol and taking pain medicines can cause dizziness and slow your breathing. It can even be deadly. Do not drink alcohol while taking pain medicines.   Pain medicines can make you react more slowly to things. Do not drive or run machinery while taking pain medicines.    Important facts about Acetaminophen (Tylenol)   Acetaminophen or Tylenol is a common pain reliever.   Check your prescription pain medication labels to see if it contains acetaminophen.   Your health care provider may tell you to take acetaminophen to help ease your pain. Ask him or her how much you should to take each day.    Some prescription pain medicines have acetaminophen and other ingredients. Using both prescription pain medicines and over the counter (OTC) acetaminophen for pain can cause you to overdose.   Max dose of acetaminophen is 4000mg/24 hours for most people. Overdosing on acetaminophen may lead to liver failure.   Read the labels on your (over the counter) medicines with care. This will help you to clearly know the list of ingredients, how much to take, and any warnings. This will prevent you from taking too much acetaminophen.   If you have questions or do not understand the   information, ask your pharmacist or health care provider to explain it to you before you take the OTC medicine.    Managing nausea  Some people have an upset stomach after surgery. This is often because of anesthesia, pain, pain medicines, or the stress of surgery. If you were on a special food plan before surgery, ask your doctor if you should follow it while you get better. The following are tips to help you manage nausea after anesthesia:   Do not push yourself to eat. Your body will tell you when to eat and how much.   Start off with clear liquids and soup. They are easier to digest.   Next try semi-solid foods such as mashed potatoes, applesauce,  and gelatin, as you feel ready.   Slowly move to solid foods. Don't eat fatty, rich, or spicy foods at first.   Do not force yourself to have 3 large meals a day. Instead eat smaller amounts more often.   Take pain medicines with a small amount of food, such as crackers or toast, to avoid nausea.     Call your surgeon if:   You have worsening pain an hour after taking pain medicine. The pain medicine may not be strong enough.   You feel too sleepy, dizzy, or groggy. The pain medicine may be too strong.   You have side effects like persistent nausea, vomiting, or skin changes such as rash, itching, or hives.    You have bleeding through your dressing or your dressing becomes saturated.   You have signs of infection such as redness, fever, or increased/foul smelling drainage.      If you have obstructive sleep apnea (OSA)  You were given anesthesia medicine during surgery to keep you comfortable and free of pain. After surgery, you may have more apnea spells because of this medicine and other medicines you were given. These episodes may last longer than usual.   At home:   Keep using the continuous positive airway pressure (CPAP) device when you sleep. Unless your health care provider tells you not to, use it when you sleep,   or take a nap; day or night. CPAP is a common device used to treat OSA.   *    Sleep/nap in upright position(ie. with pillows), or use a recliner for first week  and/or while on opioid medications or medications that are sedating.  *     Another adult needs to be with you at all times during the first 24 hours home.  *     Limit opioid use when possible and use alternative comfort measures.  * Talk with your provider before taking any pain medicine, muscle relaxants, or  sedatives. Your provider will tell you about the possible dangers of taking these         medicines.

## 2018-01-19 NOTE — Anesthesia Preprocedure Evaluation (Signed)
Anesthesia Evaluation    AIRWAY           CARDIOVASCULAR    cardiovascular exam normal       DENTAL    no notable dental hx     PULMONARY    pulmonary exam normal     OTHER FINDINGS                  Relevant Problems   No relevant active problems               Anesthesia Plan    ASA 2     general               (BMI 37  Heavy bleeding - has required transfusion)      intravenous induction   Detailed anesthesia plan: general endotracheal        Post op pain management: per surgeon    informed consent obtained    Plan discussed with CRNA.      pertinent labs reviewed             Signed by: Leanne Chang 01/19/18 3:31 PM

## 2018-01-19 NOTE — Transfer of Care (Signed)
Anesthesia Transfer of Care Note    Patient: Caitlin Morse    Procedures performed: Procedure(s):  HYSTEROSCOPY ENDOMETRIAL ABLATION  LAPAROSCOPIC, TUBAL LIGATION  LAPAROSCOPIC, SALPINGECTOMY    Anesthesia type: General ETT    Patient location:Gyn PACU    Last vitals:   Vitals:    01/19/18 1418   BP: 120/76   Pulse: 69   Resp: 15   Temp: 36.7 C (98 F)   SpO2: 100%       Post pain: Patient not complaining of pain, continue current therapy      Mental Status:sedated    Respiratory Function: tolerating face mask    Cardiovascular: stable    Nausea/Vomiting: patient not complaining of nausea or vomiting    Hydration Status: adequate    Post assessment: no apparent anesthetic complications, no reportable events and no evidence of recall     Please refer to PACU record for vital signs. RR 20. Temp 98.76F.     Signed by: Estelle June  01/19/18 6:40 PM

## 2018-01-22 ENCOUNTER — Encounter (HOSPITAL_BASED_OUTPATIENT_CLINIC_OR_DEPARTMENT_OTHER): Payer: Self-pay | Admitting: Obstetrics & Gynecology

## 2018-01-22 NOTE — Progress Notes (Signed)
Anesthesia Post Evaluation    Patient: Caitlin Morse    Procedures performed: Procedure(s):  HYSTEROSCOPY ENDOMETRIAL ABLATION  LAPAROSCOPIC, TUBAL LIGATION  LAPAROSCOPIC, SALPINGECTOMY    Anesthesia type: General ETT    Patient location:[facility] Surgical Floor    Last vitals:   Vitals:    01/19/18 2110   BP: 148/74   Pulse: 61   Resp: 16   Temp:    SpO2: 98%       Post pain: Patient not complaining of pain, continue current therapy      Mental Status:awake    Respiratory Function: tolerating room air    Cardiovascular: stable    Nausea/Vomiting: patient not complaining of nausea or vomiting    Hydration Status: adequate    Post assessment: no apparent anesthetic complications    Signed by: Lendell Caprice, 01/22/2018 12:54 PM

## 2018-01-23 NOTE — Op Note (Signed)
FULL OPERATIVE NOTE    Date Time: 01/23/18 9:44 AM  Patient Name: [facility]  Attending Physician: No att. providers found      Date of Operation:   01/19/2018    Providers Performing:   Surgeon(s):  Redmond School, MD  Levonne Lapping, MD    Circulator: Satira Sark, RN  Relief Circulator: Trinda Pascal, RN  Relief Scrub: Darra Lis  Scrub Person: Tammy Sours    Operative Procedure:   Procedure(s):  HYSTEROSCOPY ENDOMETRIAL ABLATION  LAPAROSCOPIC, TUBAL LIGATION  LAPAROSCOPIC, SALPINGECTOMY    Preoperative Diagnosis:   Pre-Op Diagnosis Codes:     * Vaginal bleeding [N93.9]    Postoperative Diagnosis:   Post-Op Diagnosis Codes:     * Vaginal bleeding [N93.9]    Indications:   Vaginal bleeding  sterilization    Operative Notes:    The patient was taken to the operating room where general anesthesia was administered and noted to be adequate. The patient was placed in the dorsal lithotomy position with her lower extremities placed in the [place] stirrups. SCDs were placed on bilateral lower extremities. The patient was prepped and draped in the normal sterile fashion. The bladder was drained with a sterile red rubber catheter.    A sterile Graves speculum was placed in the vagina with adequate visualization of the cervix. The anterior lip of the cervix was grasped with a single-toothed tenaculum for stabilization and manipulation. A Simpson uterine sound was placed through the cervical os into the uterine cavity, noting a 6 cm uterine cavity with a 4 cm cervical length (10 cm total length). The endocervical canal was then serially dilated to a measurement of 23-French to allow insertion of the diagnostic hysteroscope. The Graves speculum was replaced with a weighted speculum. Using normal saline distending medium, the 0-degree hysteroscope was introduced into the endocervical canal and advanced into the uterine cavity. Evaluation of the endocervical canal and uterine cavity revealed no  abnormalities. The left and right ostia were identified. At this point, the distending medium was stopped and the hysteroscope removed to allow completion of the curettage portion of the surgery.   A sharp curette was threaded through the endocervical canal and advanced gently into the uterine cavity. Sharp curettage was performed until a gritty sensation was noted in all locations. Adequate hemostasis was noted on completion of sharp curettage.   Attention was turned to the ablation portion of the surgery. The Novasure device was opened and length set. Then, the Novasure was inserted through the endocervical canal, advanced into the uterine cavity, and deployed in the standard fashion. Manuevers were performed, as dictated by the manufacturer, to fully deploy the device, filling the cavity, noting a cavity width of 4.5 cm. The Novasure was activated . Following completion of the ablation procedure, the Novasure was removed. Attention was returned to the diagnostic hysteroscope. Using the normal saline distending medium, the 0-degree hysteroscope was re-inserted through the endocervical canal. The endometrial cavity was clearly visualized with adequate char noted throughout. The distending medium was stopped and the hysteroscope removed. At this point, no further interventions were indicated.   . Attention was made to Laparoscopy, The Graves speculum was replaced for evaluation of the vagina and cervix. The Humi uterine manipulator was advanced through the cervix and attached to the previously placed single-toothed tenaculum to provide uterine manipulation. The speculum was then removed from the vagina.   Attention was turned to the abdomen. Marcaine 0.25% solution was injected supraumbilically and a 5-mm  incision was performed with a scalpel for the primary entry site. A 5-mm trocar and introducer were placed over the camera and, while tenting the anterior abdominal wall, the peritoneal cavity was entered under  direct visualization using a gentle twisting motion. Placement was confirmed with direct visualization and via water test. CO2 gas insufflation was initiated, and pneumoperitoneum obtained. A second and third, right and left LQ suprapubic, 5-mm trocar site was obtained in similar fashion . Trocar placement was performed under direct visualization. The patient was placed in Trendelenburg position. A survey of the patient's pelvis and abdomen revealed normal anatomy, including normal-appearing uterus, bilateral tubes with adhesions, bilateral ovaries, bowel, and liver. Appendix not visualized. Adhesions noted on the anterior abdominal wall and removed using ligature. hemostasis noted   Attention was turned to the pelvis. A blunt probe was used to sweep the bowel away from the pelvic organs. The ligature device was used to grasp the midline of the left fallopian tube and activated to cauterize the left fallopian tube until adequate blanching was noted. Left salpingectomy could not be performed due to the amount of adhesions noted to the broad ligament. Similarly, the ligature was used to grasp  the right fallopian tube and activated to cauterize the right fallopian tube and perform a right salpingectomy. Re-evaluation of the operative field noted adequate cauterization of bilateral fallopian tubes with excellent hemostasis noted throughout. At this point, no further interventions were indicated.   All instruments were removed under direct visualization and the abdomen was deflated with manual pressure. The port sites were re-approximated with 4-0 Monocryl. Excellent hemostasis was noted at the trocar sites. The Foley catheter and Acorn/Rubin uterine manipulator were removed at procedure completion. The Graves speculum was replaced for evaluation of the vagina and cervix, noting hemostasis at the previous single-toothed tenaculum clamp sites. Excellent hemostasis was noted throughout. The patient tolerated the procedure  well. All lap, sponge, and needle/instrument counts were correct times two. The patient was transported to the PACU in stable condition.     FINDINGS:  Normal pelvic and abdominal anatomy. No abnormalities noted in bilateral fallopian tubes except for  adhesions of right fallopian tube to right broad ligament. Normal  bilateral ovaries, normal  Uterus, but enlarged. Appendix not visualized . Adhesions noted at the anterior pelvic wall    Estimated Blood Loss:   25 mL    Implants:   * No implants in log *    Drains:   Drains: no    Specimens:   Right fallopian tube      Complications:   none    Signed by: Levonne Lapping, MD

## 2018-02-07 NOTE — Progress Notes (Unsigned)
Discharge notes

## 2019-07-29 ENCOUNTER — Encounter: Payer: Self-pay | Admitting: Surgery

## 2019-07-29 NOTE — H&P (Addendum)
Patient was seen in the pre operative area. Medical and surgical history was reviewed and a brief examination was done. The procedure and the risks were discussed. No changes in status.    Caitlin Morse  07/30/2019  10:28 AM  Patient Type: I     ATTENDING PHYSICIAN: Safina Huard     CHIEF COMPLAINT:  Morbid obesity and associated comorbidities, body mass index of 40.     HISTORY OF PRESENT ILLNESS:  Ms. Caitlin Morse is a very pleasant 38 year old female, morbidly obese, who  was interested in the weight loss and metabolic procedure.  Of note, she  had the previous history of a laparoscopic adjustable gastric band  placement.  She was seen and evaluated in the office and was elected for  vertical sleeve gastrectomy.  She was advised for extensive preoperative  workup including esophagogastroduodenoscopy, sleep study, etc.  On initial  visit in the office, the patient's total body weight was 245 pounds and the  ideal body weight was 150 and the excess body weight was 95 pounds.  The  body mass index was measured at 41.     PAST MEDICAL HISTORY:  Significant for:  1.  Morbid obesity.  2.  Gastroesophageal reflux disease.     PAST SURGICAL HISTORY:  Significant for:  1.  Laparoscopic adjustable gastric band placement in 2010.  2.  C-section x4, the last one was in 2015.  3.  Laparoscopic tubal ligation.     FAMILY HISTORY:  Significant for:  1.  Morbid obesity.  2.  Diabetes.  3.  Hypertension.  4.  High cholesterol.  5.  Cardiac disease.     SOCIAL HISTORY:  The patient is an ex-smoker and she quit smoking back in February of 2020.   She denies alcohol and illegal drugs.     MEDICATIONS:  The patient has been taking the below medications:  1.  Seroquel 100 mg daily.  2.  Bupropion 150 mg daily.     ALLERGIES:  The patient denies any known drug allergies.     REVIEW OF SYSTEMS:  Review of systems was reviewed with the patient for all 13 systems and  significant as HPI.     PHYSICAL EXAMINATION:  VITAL SIGNS:  Blood  pressure is 116/74, pulse is 89, respiratory rate is  16, temperature is 98.  The patient's total body weight is 244 pounds and  body mass index is 40.0.  GENERAL APPEARANCE:  The patient seems to be comfortable.  She does not  seem to be in acute distress.  HEAD AND NECK:  Neck is supple.  No lymphadenopathy was appreciated  anterior, posterior triangle of the neck.  No neck tenderness was  appreciated.  CHEST:  Lungs are clear to auscultation bilaterally, no abnormal beats and  sounds such as wheezing or rales appreciated.  HEART:  Normal sinus rhythm to auscultation.  S1, S2 with no JVD.  No  gallop or murmur was appreciated.  ABDOMEN:  The patient's abdomen is soft.  It is nondistended.  There is no  tenderness to deep palpation of 4 quadrants.  No hepatosplenomegaly,  ascites, or hernia was appreciated.  EXTREMITIES:  The patient's extremity examination is unremarkable.  No  gross deformity, cyanosis, edema, or clubbing is appreciated.  NEUROLOGIC EXAMINATION:  Within normal limits.  She has no sign of  lateralization.     PREOPERATIVE WORKUP:  1.  Sleep study:  The patient did not have any history of sleep apnea or  snoring.  She was cleared for the sleep study and the arterial blood gas  was advised to the patient which was within normal limits.  2.  Blood work:  The patient's blood work was reviewed.  It was found to  have a hemoglobin A1c at a level of 6 and glucose 102 and the iron was 19,  the ferritin 13 and the vitamin D was 11.2.  The rest of the blood work was  within normal limits.  3.  The chest x-ray, which is normal, and no cardiopulmonary disease was  appreciated.  4.  Electrocardiogram, which is a normal sinus rhythm.  5.  Abdominal ultrasound which revealed hepatomegaly and fatty liver infiltration, no  cholelithiasis is appreciated.  6.  Esophagogastroduodenoscopy, which was performed by myself and was found  to have mild gastritis.  H. pylori was negative.  7.  Psychology evaluation.  The  patient was seen by the psychiatrist and  was cleared for the procedure.     MEDICAL CLEARANCE:  The patient was seen by the primary care physician and was cleared for  surgery.     ASSESSMENT:  In summary, this is a very pleasant 38 year old female, morbidly obese, who  is interested in the weight loss and metabolic procedure.  She was seen and  evaluated in the office and was elected for vertical sleeve gastrectomy.   Of note, the patient had the previous gastric band in place.  She was  advised for the band removal, possible hiatal hernia, also sleeve  gastrectomy.  Risks of the procedure including, but not limited to,  infection, bleeding, injury to adjacent structures, injury to hollow  viscus, injury to solid organs, deep vein thrombosis, pulmonary embolism,  myocardial infarction, stroke, leakage, and even death was discussed with  the patient in detail.  In addition, the long-term complications such as  stricture, ulceration, food intolerance, meaningless weight loss, etc. was  discussed.  She fully understood and agreed with the plan.  Informed  consent was obtained.     PLAN:  The patient is going to be scheduled at [facility] in  November of 2020 for laparoscopic/robotic, possible open, gastric band  removal, sleeve gastrectomy, hiatal hernia repair, and  esophagogastroduodenoscopy.           D:  07/26/2019 18:11 PM by Dr. Lidia Collum, MD ([id number])  T:  07/26/2019 18:43 PM by NTS      Everlean Cherry: [id number]) (Doc ID: [id number])

## 2019-07-29 NOTE — Pre-Procedure Instructions (Signed)
•   Surgical Risk Level : (Low, Intermediate, High)  o intermediate     Surgeon Testing Requirements:  o Labs, ekg, psych, med clear, egd, covid     Anesthesia Guideline Requirements:  o n/a     Specialist Notes / Test Results / Records Requested:  o n/a     Recent Hospitalization / ED Visit:   o n/a     Future Plan / Upcoming Appts:   o n/a     Labs/Testing @ [facility]:   n/a   Email Sent To Patient:   o n/a     NPO Instructions given to patient:    o midnight     Faxes Sent To:   o n/a     Epic Orders Entered:   o Anesthesia orders per protocol     Other Outlying information gathered that does not fit anywhere else  o Requested EKG tracing from surgeon office     Chart Room Handoff for Further  Follow-up if Applicable:  o n/a      Visitor Restriction Guidelines:   One family member may accompany the patient on day of surgery-All persons will be screened at hospital main entrance upon arrival, which will include a temperature reading and donning a face mask.   Family members and patients must wear masks and use social distancing while in waiting areas and cafeteria area.   Once patient goes to OR, family members are encouraged to go home for inpatients or stay in car for outpatients. Physicians will call contact person to give report of procedure.      Inpatient visitation will be honored during inpatient visiting hours- 1 consistent family member to visit daily from 1300-1700. No family members will be allowed into Phase 1 recovery areas.   For outpatients, family will be called to provide discharge instructions.      Advise to call surgeon if need to cancel surgery arises.      Patient verbalized understanding and acceptance of above information.

## 2019-07-29 NOTE — Pre-Procedure Instructions (Signed)
Pt states she was told by surgeon office to arrive at hospital at 0745 for 0945 procedure, does not match up with our OR board, but I told her to follow surgeon instructions.

## 2019-07-29 NOTE — Anesthesia Preprocedure Evaluation (Signed)
Relevant Problems   No relevant active problems       PSS Anesthesia Comments: EKG- Pending  Labs-Hgb/Hct- 10/35.9, platelets-360, A1C- 6.0; CXR- No acute process;Medical clearance- Dr. Dwain Sarna.LS    Pre-evaluation Note Incomplete - DO NOT USE FOR CLINICAL DECISIONS    Anesthesia Plan                                             Signed by: Helaine Chess 07/29/19 5:17 PM

## 2019-07-30 ENCOUNTER — Inpatient Hospital Stay
Admission: RE | Admit: 2019-07-30 | Discharge: 2019-07-31 | DRG: 620 | Disposition: A | Discharge status: Home / Self Care | Payer: BC Managed Care – PPO | Source: Ambulatory Visit | Attending: Surgery | Admitting: Surgery

## 2019-07-30 ENCOUNTER — Ambulatory Visit: Payer: Self-pay

## 2019-07-30 ENCOUNTER — Encounter: Payer: Self-pay | Admitting: Surgery

## 2019-07-30 ENCOUNTER — Inpatient Hospital Stay: Payer: BC Managed Care – PPO | Admitting: Acute Care

## 2019-07-30 ENCOUNTER — Encounter
Admission: RE | Disposition: A | Discharge status: Home / Self Care | Payer: Self-pay | Source: Ambulatory Visit | Attending: Surgery

## 2019-07-30 DIAGNOSIS — N736 Female pelvic peritoneal adhesions (postinfective): Secondary | ICD-10-CM | POA: Diagnosis present

## 2019-07-30 DIAGNOSIS — Z9884 Bariatric surgery status: Secondary | ICD-10-CM

## 2019-07-30 DIAGNOSIS — K9509 Other complications of gastric band procedure: Secondary | ICD-10-CM | POA: Diagnosis present

## 2019-07-30 DIAGNOSIS — K317 Polyp of stomach and duodenum: Secondary | ICD-10-CM | POA: Diagnosis present

## 2019-07-30 DIAGNOSIS — R110 Nausea: Secondary | ICD-10-CM | POA: Diagnosis present

## 2019-07-30 DIAGNOSIS — Z6841 Body Mass Index (BMI) 40.0 and over, adult: Secondary | ICD-10-CM

## 2019-07-30 DIAGNOSIS — K2100 Gastro-esophageal reflux disease with esophagitis, without bleeding: Secondary | ICD-10-CM | POA: Diagnosis present

## 2019-07-30 DIAGNOSIS — Y831 Surgical operation with implant of artificial internal device as the cause of abnormal reaction of the patient, or of later complication, without mention of misadventure at the time of the procedure: Secondary | ICD-10-CM | POA: Diagnosis present

## 2019-07-30 DIAGNOSIS — Z87891 Personal history of nicotine dependence: Secondary | ICD-10-CM

## 2019-07-30 DIAGNOSIS — K219 Gastro-esophageal reflux disease without esophagitis: Secondary | ICD-10-CM

## 2019-07-30 DIAGNOSIS — K449 Diaphragmatic hernia without obstruction or gangrene: Secondary | ICD-10-CM | POA: Diagnosis present

## 2019-07-30 DIAGNOSIS — K2951 Unspecified chronic gastritis with bleeding: Secondary | ICD-10-CM

## 2019-07-30 LAB — URINE HCG QUALITATIVE: Urine HCG Qualitative: NEGATIVE

## 2019-07-30 SURGERY — LAPAROSCOPIC, SLEEVE GASTRECTOMY, CONVERSION FROM LAP BAND
Anesthesia: Anesthesia General | Site: Abdomen | Wound class: Clean Contaminated

## 2019-07-30 MED ORDER — LIDOCAINE HCL 1 % IJ SOLN
INTRAMUSCULAR | Status: DC | PRN
Start: 2019-07-30 — End: 2019-07-30
  Administered 2019-07-30: 12.5 mL

## 2019-07-30 MED ORDER — MEPERIDINE HCL 25 MG/ML IJ SOLN
12.5000 mg | Freq: Once | INTRAMUSCULAR | Status: DC | PRN
Start: 2019-07-30 — End: 2019-07-30

## 2019-07-30 MED ORDER — HYDROMORPHONE HCL 1 MG/ML IJ SOLN
INTRAMUSCULAR | Status: AC
Start: 2019-07-30 — End: ?
  Filled 2019-07-30: qty 1

## 2019-07-30 MED ORDER — OXYCODONE-ACETAMINOPHEN 5-325 MG PO TABS
1.0000 | ORAL_TABLET | Freq: Once | ORAL | Status: DC | PRN
Start: 2019-07-30 — End: 2019-07-30

## 2019-07-30 MED ORDER — ALBUTEROL SULFATE 1.25 MG/3ML IN NEBU
1.2500 mg | INHALATION_SOLUTION | RESPIRATORY_TRACT | Status: DC | PRN
Start: 2019-07-30 — End: 2019-07-31

## 2019-07-30 MED ORDER — DIPHENHYDRAMINE HCL 50 MG/ML IJ SOLN
12.5000 mg | Freq: Four times a day (QID) | INTRAMUSCULAR | Status: DC | PRN
Start: 2019-07-30 — End: 2019-07-31

## 2019-07-30 MED ORDER — DEXAMETHASONE SODIUM PHOSPHATE 4 MG/ML IJ SOLN (WRAP)
INTRAMUSCULAR | Status: DC | PRN
Start: 2019-07-30 — End: 2019-07-30
  Administered 2019-07-30: 4 mg via INTRAVENOUS

## 2019-07-30 MED ORDER — FENTANYL CITRATE (PF) 50 MCG/ML IJ SOLN (WRAP)
INTRAMUSCULAR | Status: AC
Start: 2019-07-30 — End: ?
  Filled 2019-07-30: qty 2

## 2019-07-30 MED ORDER — FAMOTIDINE 10 MG/ML IV SOLN (WRAP)
20.0000 mg | Freq: Two times a day (BID) | INTRAVENOUS | Status: DC
Start: 2019-07-30 — End: 2019-07-31
  Administered 2019-07-30 – 2019-07-31 (×2): 20 mg via INTRAVENOUS
  Filled 2019-07-30 (×2): qty 2

## 2019-07-30 MED ORDER — KETOROLAC TROMETHAMINE 15 MG/ML IJ SOLN
15.0000 mg | Freq: Four times a day (QID) | INTRAMUSCULAR | Status: DC
Start: 2019-07-31 — End: 2019-07-31
  Administered 2019-07-31: 06:00:00 15 mg via INTRAVENOUS
  Filled 2019-07-30: qty 1

## 2019-07-30 MED ORDER — ROCURONIUM BROMIDE 50 MG/5ML IV SOLN
INTRAVENOUS | Status: AC
Start: 2019-07-30 — End: ?
  Filled 2019-07-30: qty 5

## 2019-07-30 MED ORDER — BUPIVACAINE HCL 0.5 % IJ SOLN
INTRAMUSCULAR | Status: DC | PRN
Start: 2019-07-30 — End: 2019-07-30

## 2019-07-30 MED ORDER — SIMETHICONE 80 MG PO CHEW
80.0000 mg | CHEWABLE_TABLET | Freq: Four times a day (QID) | ORAL | Status: DC | PRN
Start: 2019-07-31 — End: 2019-07-31

## 2019-07-30 MED ORDER — HYDROMORPHONE HCL 1 MG/ML IJ SOLN
1.00 mg | INTRAMUSCULAR | Status: DC | PRN
Start: 2019-07-30 — End: 2019-07-31
  Administered 2019-07-30 – 2019-07-31 (×5): 1 mg via INTRAVENOUS
  Filled 2019-07-30 (×5): qty 1

## 2019-07-30 MED ORDER — DEXMEDETOMIDINE HCL IN NACL 200 MCG/50ML IV SOLN
INTRAVENOUS | Status: AC
Start: 2019-07-30 — End: ?
  Filled 2019-07-30: qty 50

## 2019-07-30 MED ORDER — BUPIVACAINE HCL (PF) 0.5 % IJ SOLN
INTRAMUSCULAR | Status: AC
Start: 2019-07-30 — End: ?
  Filled 2019-07-30: qty 30

## 2019-07-30 MED ORDER — DIPHENHYDRAMINE HCL 50 MG/ML IJ SOLN
6.2500 mg | Freq: Four times a day (QID) | INTRAMUSCULAR | Status: DC | PRN
Start: 2019-07-30 — End: 2019-07-30

## 2019-07-30 MED ORDER — CEFAZOLIN SODIUM-DEXTROSE 2-5 GM/50ML-% IV SOLN
2.00 g | Freq: Once | INTRAVENOUS | Status: AC
Start: 2019-07-30 — End: 2019-07-30
  Administered 2019-07-30: 11:00:00 2 g via INTRAVENOUS

## 2019-07-30 MED ORDER — PROMETHAZINE HCL 25 MG/ML IJ SOLN
6.2500 mg | Freq: Once | INTRAMUSCULAR | Status: DC | PRN
Start: 2019-07-30 — End: 2019-07-30

## 2019-07-30 MED ORDER — ONDANSETRON HCL 4 MG/2ML IJ SOLN
INTRAMUSCULAR | Status: AC
Start: 2019-07-30 — End: ?
  Filled 2019-07-30: qty 2

## 2019-07-30 MED ORDER — GLYCOPYRROLATE 1 MG/5ML IJ SOLN
INTRAMUSCULAR | Status: AC
Start: 2019-07-30 — End: ?
  Filled 2019-07-30: qty 5

## 2019-07-30 MED ORDER — NEOSTIGMINE METHYLSULFATE 1 MG/ML IJ/IV SOLN (WRAP)
Status: DC | PRN
Start: 2019-07-30 — End: 2019-07-30
  Administered 2019-07-30: 4 mg via INTRAVENOUS

## 2019-07-30 MED ORDER — KCL IN DEXTROSE-NACL 20-5-0.45 MEQ/L-%-% IV SOLN
INTRAVENOUS | Status: DC
Start: 2019-07-30 — End: 2019-07-31

## 2019-07-30 MED ORDER — HYDROMORPHONE HCL 2 MG PO TABS
2.0000 mg | ORAL_TABLET | Freq: Once | ORAL | Status: DC | PRN
Start: 2019-07-30 — End: 2019-07-30

## 2019-07-30 MED ORDER — HYDROMORPHONE HCL 1 MG/ML IJ SOLN
INTRAMUSCULAR | Status: DC | PRN
Start: 2019-07-30 — End: 2019-07-30
  Administered 2019-07-30: .25 mg via INTRAVENOUS

## 2019-07-30 MED ORDER — GLUCOSE 40 % PO GEL
15.00 g | ORAL | Status: DC | PRN
Start: 2019-07-30 — End: 2019-07-31

## 2019-07-30 MED ORDER — HYOSCYAMINE SULFATE 0.125 MG SL SUBL
0.1250 mg | SUBLINGUAL_TABLET | Freq: Four times a day (QID) | SUBLINGUAL | Status: DC | PRN
Start: 2019-07-30 — End: 2019-07-31

## 2019-07-30 MED ORDER — FENTANYL CITRATE (PF) 50 MCG/ML IJ SOLN (WRAP)
INTRAMUSCULAR | Status: DC | PRN
Start: 2019-07-30 — End: 2019-07-30
  Administered 2019-07-30: 50 ug via INTRAVENOUS
  Administered 2019-07-30: 100 ug via INTRAVENOUS

## 2019-07-30 MED ORDER — ACETAMINOPHEN 10 MG/ML IV SOLN
1000.00 mg | Freq: Four times a day (QID) | INTRAVENOUS | Status: AC
Start: 2019-07-30 — End: 2019-07-31
  Administered 2019-07-30 – 2019-07-31 (×3): 1000 mg via INTRAVENOUS
  Filled 2019-07-30 (×3): qty 100

## 2019-07-30 MED ORDER — DEXTROSE 50 % IV SOLN
12.50 g | INTRAVENOUS | Status: DC | PRN
Start: 2019-07-30 — End: 2019-07-31

## 2019-07-30 MED ORDER — GLUCAGON 1 MG IJ SOLR (WRAP)
1.00 mg | INTRAMUSCULAR | Status: DC | PRN
Start: 2019-07-30 — End: 2019-07-31

## 2019-07-30 MED ORDER — SUCCINYLCHOLINE CHLORIDE 20 MG/ML IJ SOLN
INTRAMUSCULAR | Status: DC | PRN
Start: 2019-07-30 — End: 2019-07-30
  Administered 2019-07-30: 160 mg via INTRAVENOUS

## 2019-07-30 MED ORDER — PROPOFOL 10 MG/ML IV EMUL (WRAP)
INTRAVENOUS | Status: DC | PRN
Start: 2019-07-30 — End: 2019-07-30
  Administered 2019-07-30: 250 mg via INTRAVENOUS
  Administered 2019-07-30: 50 mg via INTRAVENOUS

## 2019-07-30 MED ORDER — SCOPOLAMINE 1 MG/3DAYS TD PT72
1.00 | MEDICATED_PATCH | TRANSDERMAL | Status: DC | PRN
Start: 2019-07-30 — End: 2019-07-31

## 2019-07-30 MED ORDER — HEPARIN SODIUM (PORCINE) 5000 UNIT/ML IJ SOLN
5000.00 [IU] | Freq: Once | INTRAMUSCULAR | Status: AC
Start: 2019-07-30 — End: 2019-07-30
  Administered 2019-07-30: 10:00:00 5000 [IU] via SUBCUTANEOUS

## 2019-07-30 MED ORDER — FENTANYL CITRATE (PF) 50 MCG/ML IJ SOLN (WRAP)
50.0000 ug | INTRAMUSCULAR | Status: DC | PRN
Start: 2019-07-30 — End: 2019-07-30
  Administered 2019-07-30 (×2): 50 ug via INTRAVENOUS
  Filled 2019-07-30: qty 2

## 2019-07-30 MED ORDER — DEXAMETHASONE SODIUM PHOSPHATE 4 MG/ML IJ SOLN
INTRAMUSCULAR | Status: AC
Start: 2019-07-30 — End: ?
  Filled 2019-07-30: qty 1

## 2019-07-30 MED ORDER — LACTATED RINGERS IV SOLN
INTRAVENOUS | Status: DC
Start: 2019-07-30 — End: 2019-07-31

## 2019-07-30 MED ORDER — GABAPENTIN 300 MG/6ML PO SOLN
300.00 mg | Freq: Three times a day (TID) | ORAL | Status: DC
Start: 2019-07-31 — End: 2019-07-31
  Administered 2019-07-31: 11:00:00 300 mg via ORAL
  Filled 2019-07-30: qty 6

## 2019-07-30 MED ORDER — NEOSTIGMINE METHYLSULFATE 1 MG/ML IJ/IV SOLN (WRAP)
Status: AC
Start: 2019-07-30 — End: ?
  Filled 2019-07-30: qty 5

## 2019-07-30 MED ORDER — SUCCINYLCHOLINE CHLORIDE 20 MG/ML IJ SOLN
INTRAMUSCULAR | Status: AC
Start: 2019-07-30 — End: ?
  Filled 2019-07-30: qty 10

## 2019-07-30 MED ORDER — NON FORMULARY
1000.00 mg | Freq: Four times a day (QID) | Status: DC
Start: 2019-07-30 — End: 2019-07-30

## 2019-07-30 MED ORDER — MIDAZOLAM HCL 1 MG/ML IJ SOLN (WRAP)
INTRAMUSCULAR | Status: DC | PRN
Start: 2019-07-30 — End: 2019-07-30
  Administered 2019-07-30: 2 mg via INTRAVENOUS

## 2019-07-30 MED ORDER — HYDROCODONE-ACETAMINOPHEN 7.5-325 MG/15ML PO SOLN
10.0000 mL | Freq: Four times a day (QID) | ORAL | Status: DC | PRN
Start: 2019-07-31 — End: 2019-07-31
  Administered 2019-07-31: 10 mL via ORAL
  Filled 2019-07-30: qty 15

## 2019-07-30 MED ORDER — PROCHLORPERAZINE EDISYLATE 10 MG/2ML IJ SOLN
5.00 mg | Freq: Four times a day (QID) | INTRAMUSCULAR | Status: DC | PRN
Start: 2019-07-30 — End: 2019-07-31

## 2019-07-30 MED ORDER — GLYCOPYRROLATE 0.2 MG/ML IJ SOLN (WRAP)
INTRAMUSCULAR | Status: DC | PRN
Start: 2019-07-30 — End: 2019-07-30
  Administered 2019-07-30: .2 mg via INTRAVENOUS
  Administered 2019-07-30: 0.2 mg via INTRAVENOUS
  Administered 2019-07-30: .6 mg via INTRAVENOUS

## 2019-07-30 MED ORDER — HEPARIN SODIUM (PORCINE) 5000 UNIT/ML IJ SOLN
INTRAMUSCULAR | Status: AC
Start: 2019-07-30 — End: ?
  Filled 2019-07-30: qty 1

## 2019-07-30 MED ORDER — CEFAZOLIN SODIUM-DEXTROSE 2-5 GM/50ML-% IV SOLN
INTRAVENOUS | Status: AC
Start: 2019-07-30 — End: ?
  Filled 2019-07-30: qty 50

## 2019-07-30 MED ORDER — LIDOCAINE HCL 2 % IJ SOLN
INTRAMUSCULAR | Status: DC | PRN
Start: 2019-07-30 — End: 2019-07-30
  Administered 2019-07-30: 100 mg

## 2019-07-30 MED ORDER — BUPIVACAINE HCL (PF) 0.5 % IJ SOLN
INTRAMUSCULAR | Status: DC | PRN
Start: 2019-07-30 — End: 2019-07-30
  Administered 2019-07-30: 5 mL
  Administered 2019-07-30: 12.5 mL

## 2019-07-30 MED ORDER — PROPOFOL 10 MG/ML IV EMUL (WRAP)
INTRAVENOUS | Status: AC
Start: 2019-07-30 — End: ?
  Filled 2019-07-30: qty 20

## 2019-07-30 MED ORDER — ACETAMINOPHEN 10 MG/ML IV SOLN
1000.00 mg | Freq: Once | INTRAVENOUS | Status: AC
Start: 2019-07-30 — End: 2019-07-30
  Administered 2019-07-30: 13:00:00 1000 mg via INTRAVENOUS
  Filled 2019-07-30: qty 100

## 2019-07-30 MED ORDER — DEXMEDETOMIDINE HCL IN NACL 200 MCG/50ML IV SOLN
INTRAVENOUS | Status: DC | PRN
Start: 2019-07-30 — End: 2019-07-30
  Administered 2019-07-30: .5 ug/kg/h via INTRAVENOUS

## 2019-07-30 MED ORDER — LIDOCAINE HCL (PF) 1 % IJ SOLN
INTRAMUSCULAR | Status: AC
Start: 2019-07-30 — End: ?
  Filled 2019-07-30: qty 30

## 2019-07-30 MED ORDER — ROCURONIUM BROMIDE 10 MG/ML IV SOLN (WRAP)
INTRAVENOUS | Status: DC | PRN
Start: 2019-07-30 — End: 2019-07-30
  Administered 2019-07-30 (×5): 10 mg via INTRAVENOUS
  Administered 2019-07-30: 30 mg via INTRAVENOUS

## 2019-07-30 MED ORDER — ENOXAPARIN SODIUM 40 MG/0.4ML SC SOLN
40.0000 mg | Freq: Every day | SUBCUTANEOUS | Status: DC
Start: 2019-07-31 — End: 2019-07-31
  Administered 2019-07-31: 08:00:00 40 mg via SUBCUTANEOUS
  Filled 2019-07-30: qty 0.4

## 2019-07-30 MED ORDER — ONDANSETRON HCL 4 MG/2ML IJ SOLN
4.0000 mg | Freq: Once | INTRAMUSCULAR | Status: DC | PRN
Start: 2019-07-30 — End: 2019-07-30

## 2019-07-30 MED ORDER — KETOROLAC TROMETHAMINE 30 MG/ML IJ SOLN
30.0000 mg | Freq: Once | INTRAMUSCULAR | Status: AC
Start: 2019-07-31 — End: 2019-07-31
  Administered 2019-07-31: 01:00:00 30 mg via INTRAVENOUS
  Filled 2019-07-30: qty 1

## 2019-07-30 MED ORDER — ONDANSETRON HCL 4 MG/2ML IJ SOLN
4.0000 mg | Freq: Three times a day (TID) | INTRAMUSCULAR | Status: DC | PRN
Start: 2019-07-30 — End: 2019-07-31

## 2019-07-30 MED ORDER — LABETALOL HCL 5 MG/ML IV SOLN (WRAP)
INTRAVENOUS | Status: DC | PRN
Start: 2019-07-30 — End: 2019-07-30
  Administered 2019-07-30 (×2): 10 mg via INTRAVENOUS
  Administered 2019-07-30: 5 mg via INTRAVENOUS

## 2019-07-30 MED ORDER — LIDOCAINE HCL (PF) 2 % IJ SOLN
INTRAMUSCULAR | Status: AC
Start: 2019-07-30 — End: ?
  Filled 2019-07-30: qty 5

## 2019-07-30 MED ORDER — MIDAZOLAM HCL 1 MG/ML IJ SOLN (WRAP)
INTRAMUSCULAR | Status: AC
Start: 2019-07-30 — End: ?
  Filled 2019-07-30: qty 2

## 2019-07-30 MED ORDER — ACETAMINOPHEN 10 MG/ML IV SOLN
INTRAVENOUS | Status: AC
Start: 2019-07-30 — End: ?
  Filled 2019-07-30: qty 100

## 2019-07-30 MED ORDER — ONDANSETRON HCL 4 MG/2ML IJ SOLN
INTRAMUSCULAR | Status: DC | PRN
Start: 2019-07-30 — End: 2019-07-30
  Administered 2019-07-30: 4 mg via INTRAVENOUS

## 2019-07-30 MED ORDER — GABAPENTIN 300 MG PO CAPS
300.0000 mg | ORAL_CAPSULE | Freq: Three times a day (TID) | ORAL | Status: DC
Start: 2019-07-31 — End: 2019-07-30

## 2019-07-30 SURGICAL SUPPLY — 103 items
APPLICATOR CHLORAPREP 26 ML 70% ISOPROPYL ALCOHOL 2% CHLORHEXIDINE (Applicator) ×2 IMPLANT
APPLICATOR PRP 70% ISPRP 2% CHG 26ML (Applicator) ×3
APPLICATOR SRG DPLTP 5MM 32CM LF SNPLK (Procedure Accessories) ×3
APPLICATOR SURGICAL L32 CM OD5 MM SNAP LOCK FIBRIN SEALANT DUPLOTIP (Procedure Accessories) ×2 IMPLANT
APPLIER IN CLP TI LG LGCLP 12MM 13.4IN (Staplers)
APPLIER INTERNAL CLIP LARGE L13.4 IN (Staplers)
APPLIER INTERNAL CLIP LARGE L13.4 IN TITANIUM ROTATE MULTIPLE 20 CLIP (Staplers) IMPLANT
DEVICE SUT E STCH 10MM 15IN LF STRL (Laparoscopy Supplies) ×1
DEVICE SUTURING L15 IN PUSH BUTTON GRIP (Laparoscopy Supplies) ×2
DEVICE SUTURING L15 IN PUSH BUTTON GRP HNDL 10MM ENDO STTCH ENDOSCOPIC (Laparoscopy Supplies) ×2 IMPLANT
DEVICE SUTURING TROCAR SITE SPRING LOAD (Laparoscopy Supplies) ×2
DEVICE SUTURING TROCAR SITE SPRNG LOAD BLUNT STLT 10MM ENDO ENDOSCOPIC (Laparoscopy Supplies) ×2 IMPLANT
DRAPE SRG SMS .75 PRXM 77X53IN LF STRL (Drape) ×2
DRAPE SURGICAL SHEET L77 IN X W53 IN (Drape) ×4
DRAPE SURGICAL SHEET L77 IN X W53 IN MEDLINE SMS 3/4 BLUE (Drape) ×4 IMPLANT
GOWN SRG XL SMARTGOWN LF STRL LVL 4 (Gown) ×1
GOWN SURGICAL XL SMARTGOWN LEVEL 4 (Gown) ×2
GOWN SURGICAL XL SMARTGOWN LEVEL 4 BREATHABLE (Gown) ×2 IMPLANT
HANDLE LIGHT ADAPTIVE LIGHT CONTROL PLUS (Other) ×2
HANDLE LIGHT ADAPTIVE LIGHT CONTROL PLUS TECHNOLOGY SNAP ON LENS TOUCH (Other) ×2 IMPLANT
HNDL LGHT ADP LGHT CNTRL + TCH SNPON LEN (Other) ×1
HOLDER ENDOSCOPIC INSTRUMENT TUBE SET (Endoscopic Supplies) ×2
HOLDER ENDOSCOPIC INSTRUMENT TUBE SET ACTIVATION BUTTON WINGMAN (Endoscopic Supplies) ×2 IMPLANT
IRRIGATOR SUCTION ERGONOMIC HAND PIECE STRYKEFLOW II (Suction) ×2 IMPLANT
IRRIGATOR SUCTION STRYKEFLOW 2 (Suction) ×2
KIT INFECTION CONTROL CUSTOM (Kits) ×3
KIT INFECTION CONTROL CUSTOM IFOH03 (Kits) ×2 IMPLANT
KIT TISS CLSR TSSL 4ML PRIMA DPJT PREFL (Hemostat) ×1
KIT TISSUE CLOSURE PREFILL SYRINGE (Hemostat) ×2
KIT TISSUE CLOSURE PREFILL SYRINGE FROZEN COMBI SET TISSEEL 4 ML (Hemostat) ×2 IMPLANT
MAT PATIENT L81 IN X W39 IN M2 (Positioning Supplies) ×2
MAT PATIENT L81 IN X W39 IN M2 MICROCLIMATE BODY PAD PREVALON MOBILE (Positioning Supplies) ×2 IMPLANT
MAT PT PREVALON 81X39IN M2 MICROCLIMATE (Positioning Supplies) ×1
NEEDLE INSFL SS PLS EPTH 14GA 150MM STRL (Needles) ×1
NEEDLE INSUFFLATION L150 MM OD14 GA (Needles) ×2
NEEDLE INSUFFLATION L150 MM OD14 GA ENDOPATHÂ® SPRING LOAD BLUNT STYLET (Needles) ×2 IMPLANT
NEEDLE SPINAL BD OD22 GA L3 1/2 IN (Needles) ×2
NEEDLE SPINAL L3 1/2 IN REGULAR WALL QUINCKE TIP OD22 GA BD (Needles) ×2 IMPLANT
NEEDLE SPNL PP RW BD QNCK 22GA 3.5IN LF (Needles) ×1
PACK SRG LF STRL GN LAPSCP DISP FFX (Pack) ×1
PACK SURGICAL GEN LAPAROSCOPY FFX (Pack) ×2 IMPLANT
PUMP ON-Q NO CATH 400 ML X 4ML (Pain Med Pump) ×1
RELOAD STAPLER 3 MM 3.5 MM 4 MM L60 MM (Staplers) ×12
RELOAD STAPLER 3 MM 3.5 MM 4 MM L60 MM ENDO GIA TITANIUM MEDIUM THICK (Staplers) ×12 IMPLANT
RELOAD STPLR TI 3MM 3.5MM 4MM EGIA 60MM (Staplers) ×6
RELOAD SUT AS FIBROIN SS 0 ES-9 E STCH (Suture) ×4
RELOAD SUTURE ASSISTANT L48 IN 0 ES-9 (Suture) ×8
RELOAD SUTURE ASSISTANT L48 IN 0 ES-9 NONABSORBABLE NONMUTAGENIC ENDO (Suture) ×8 IMPLANT
RELOAD SUTURE ASSISTANT L48 IN 2-0 ES-9 (Laparoscopy Supplies) ×6
RELOAD SUTURE L48 IN 2-0 ES-9 COATED ENDO STITCH SRGDC PLSTR GRN (Laparoscopy Supplies) ×6 IMPLANT
SCISSORS L21 CM ENDOPATH 360 D CURVE (Instrument) ×2
SCISSORS L21 CM ENDOPATH 360 D CURVE RATCHET HANDLE MONOPOLAR CAUTERY (Instrument) ×2 IMPLANT
SCISSORS LAPSCP 360D CRV EPTH 5MM 21CM (Instrument) ×1
SET HEATED TUBE WITH RPT (Ortho Supply) ×1
SET TUBE W/BTN F/SCOPE HLDR (Endoscopic Supplies) ×1
SHEARS LAPAROSCOPIC L45 CM CURVE TIP (Cautery) ×2
SHEARS LAPAROSCOPIC L45 CM CURVE TIP ADVANCE HEMOSTASIS ULTRASONIC OD5 (Cautery) ×2 IMPLANT
SHEARS LAPSCP HRMN A +7 5MM 45CM CRV TIP (Cautery) ×1
SLEEVE LAPSCP 5MM 100MM EPTH XCL UNV STAB CNN (Procedure Accessories) ×3
SLEEVE LAPSCP UNV EPTH XCL 5MM 100MM (Procedure Accessories) ×6
SLEEVE TROCAR L100 MM UNIVERSAL STABILITY OD5 MM ENDOPATH XCEL (Procedure Accessories) ×6 IMPLANT
SOLUTION IV LACTATED RINGERS 1000 ML (IV Solutions) ×2
SOLUTION IV LACTATED RINGERS 1000 ML PLASTIC CONTAINER (IV Solutions) ×2 IMPLANT
SOLUTION IV LR 1000ML VFLX LF PLS CNTNR (IV Solutions) ×1
SPONGE LAP 18X18IN PREWASH WHT (Sponge) ×1
SPONGE LAPAROTOMY L18 IN X W18 IN (Sponge) ×2
SPONGE LAPAROTOMY L18 IN X W18 IN PREWASH WHITE (Sponge) ×2 IMPLANT
STAPLER POWER POWER CONTROL SHELL SIGNIA (Staplers) ×2 IMPLANT
STAPLER PWR SIGNIA PWR CNTRL SHL (Staplers) ×1
STRIP SKIN CLOSURE L4 IN X W1/2 IN (Dressing) ×2
STRIP SKIN CLOSURE L4 IN X W1/2 IN REINFORCE STERI-STRIP POLYESTER (Dressing) ×2 IMPLANT
STRIP SKNCLS PLSTR STRSTRP 4X.5IN LF (Dressing) ×1
SURGIDAC ENDO STITCH 2-0 (Laparoscopy Supplies) ×3
SUTURE ABS 0 CT1 PDS2 27IN MFL VIOL (Suture) ×2
SUTURE ABS 4-0 PS2 MNCRL MTPS 27IN MFL (Suture) ×2
SUTURE MONOCRYL 4-0 PS-2 L27 IN (Suture) ×4
SUTURE MONOCRYL 4-0 PS-2 L27 IN MONOFILAMENT UNDYED ABSORBABLE (Suture) ×4 IMPLANT
SUTURE PDS II 0 CT-1 L27 IN MONOFILAMENT (Suture) ×4
SUTURE PDS II 0 CT-1 L27 IN MONOFILAMENT VIOLET ABSORBABLE (Suture) ×4 IMPLANT
SYRINGE 30 ML CONCENTRIC TIP GRADUATE (Syringes, Needles) ×2
SYRINGE 30 ML CONCENTRIC TIP GRADUATE NONPYROGENIC DEHP FREE LOK (Syringes, Needles) ×2 IMPLANT
SYRINGE MED 30ML LL LF STRL CONC TIP (Syringes, Needles) ×1
SYSTEM DRG DLV DEHP ONQ PNBSTR SLSKR 5IN (Catheter) ×2 IMPLANT
SYSTEM DRUG DELIVERY 4 DAY DELIVERY TIME (Pain Med Pump) ×2
SYSTEM DRUG DELIVERY 4 DAY DELIVERY TIME 2 SITE NEEDLE KIT ON-Q* 4 (Pain Med Pump) ×2 IMPLANT
SYSTEM DRUG DELIVERY L5 IN CATHETER (Catheter) ×4 IMPLANT
SYSTEM DRUG DELIVERY L5 IN CATHETER EXPANSION KIT ON-Q* DEHP (Catheter) ×4 IMPLANT
SYSTEM IMAGING 8X6IN CLEARIFY MICROFIBER WARM HUB TRCR WIPE DSPSBL (Kits) ×2 IMPLANT
SYSTEM IMG MRFBR CLEARIFY 8X6IN WRM HUB (Kits) ×3
TROCAR ENDOCLOSURE DEVICE (Laparoscopy Supplies) ×1
TROCAR LAPAROSCOPIC BLADELESS STABILITY SLEEVE L100 MM OD12 MM (Laparoscopy Supplies) ×2 IMPLANT
TROCAR LAPAROSCOPIC STABILITY SLEEVE (Laparoscopy Supplies) ×2
TROCAR LAPAROSCOPIC STABILITY SLEEVE (Procedure Accessories) ×2
TROCAR LAPAROSCOPIC STABILITY SLEEVE DILATE TIP L100 MM OD12 MM (Laparoscopy Supplies) ×2 IMPLANT
TROCAR LAPAROSCOPIC STABILITY SLEEVE DILATE TIP L100 MM OD5 MM (Procedure Accessories) ×2 IMPLANT
TROCAR LAPSCP EPTH XCL 12MM 100MM LF (Laparoscopy Supplies) ×4
TROCAR LAPSCP EPTH XCL 5MM 100MM LF STRL (Procedure Accessories) ×1
TUBING INSUFFLATION W12.52 IN X H5.83 IN (Ortho Supply) ×2
TUBING INSUFFLATION W12.52 IN X H5.83 IN C19.84 LB 150VA D18.7 IN (Ortho Supply) ×2 IMPLANT
TUBING SCT IRR (Suction) ×1
TUNNELER SRG ONQ 17GA 8IN LF STRL SHTH (Procedure Accessories) ×1
TUNNELER SURGICAL L8 IN SHEATH OD17 GA (Procedure Accessories) ×2
TUNNELER SURGICAL L8 IN SHEATH OD17 GA ON-Q* (Procedure Accessories) ×2 IMPLANT

## 2019-07-30 NOTE — Anesthesia Preprocedure Evaluation (Signed)
Anesthesia Evaluation    AIRWAY    Mallampati: II    TM distance: >3 FB  Neck ROM: full  Mouth Opening:full  Planned to use difficult airway equipment: No CARDIOVASCULAR    cardiovascular exam normal and regular       DENTAL    no notable dental hx       PULMONARY    pulmonary exam normal     OTHER FINDINGS                Scheduled  Procedure(s) with comments:  LAPAROSCOPIC, SLEEVE GASTRECTOMY, CONVERSION FROM LAP BAND - Band and port removal, Laparoscopic vertical sleeve gastrectomy  q1-unk, anes-gen, asst-Y and  Dr. Regan Lemming, md req- , equip-n bb-n 241  EGD - **EGD notified [facility] 07/29/19  Attending Physician: Lidia Collum, MD  Admission Date:07/30/2019      Assessment:      Patient Active Problem List   Diagnosis    Abnormal uterine bleeding (AUB)          Past Medical History:   Diagnosis Date    Anemia 01/12/2018    Hgb 6.7    Anxiety     Depression     Not on a medication right now.     Encounter for blood transfusion 04/2017    01/18/18 [facility] - transfusing 2 units PRBC    Gastroesophageal reflux disease     Pap smear for cervical cancer screening 02/2017    normal             Past Surgical History:   Procedure Laterality Date    CESAREAN SECTION      X 4    EGD      HYSTEROSCOPY, ENDOMETRIAL ABLATION, NOVASURE N/A 01/19/2018    Procedure: HYSTEROSCOPY ENDOMETRIAL ABLATION;  Surgeon: Redmond School, MD;  Location: [facility];  Service: Gynecology;  Laterality: N/A;    LAPAROSCOPIC INSERTION ADJUSTABLE GASTRIC BAND      LAPAROSCOPIC, SALPINGECTOMY Right 01/19/2018    Procedure: LAPAROSCOPIC, SALPINGECTOMY;  Surgeon: Redmond School, MD;  Location: [facility];  Service: Gynecology;  Laterality: Right;    LAPAROSCOPIC, TUBAL LIGATION N/A 01/19/2018    Procedure: LAPAROSCOPIC, TUBAL LIGATION;  Surgeon: Redmond School, MD;  Location: [facility];  Service: Gynecology;  Laterality: N/A;       Social History     Social History     Tobacco Use    Smoking status:  Former Smoker     Packs/day: 1.00     Years: 2.00     Pack years: 2.00     Quit date: 05/29/2019     Years since quitting: 0.1    Smokeless tobacco: Never Used   Substance Use Topics    Alcohol use: Yes     Alcohol/week: 1.0 standard drinks     Types: 1 Glasses of wine per week     Comment: for very special occassion       Allergies:   No Known Allergies  Hospital Medications:     Current Facility-Administered Medications   Medication Dose Route Frequency     Home Medications:     Medications Prior to Admission   Medication Sig Dispense Refill Last Dose    buPROPion SR (WELLBUTRIN SR) 150 MG 12 hr tablet Take 150 mg by mouth   Past Week at Unknown time    pantoprazole (PROTONIX) 40 MG tablet Take 40 mg by mouth   Past Week at Unknown time  Vitals:     Vitals:    07/30/19 0947   BP: 112/73   Resp: (!) 71   Temp: (!) 35.9 C (96.7 F)   SpO2: 97%       Labs:     Results     Procedure Component Value Units Date/Time    Urine hCG Qualitative [[id number]] Collected: 07/30/19 0932    Specimen: Urine Updated: 07/30/19 0944     Urine HCG Qualitative Negative    Narrative:      Pre-op as per protocol    OTHER LABORATORY SCANS [[id number]] Resulted: 07/29/19 1136     Updated: 07/29/19 1136    OTHER LABORATORY SCANS [[id number]] Resulted: 07/29/19 1136     Updated: 07/29/19 1136    OUTSIDE PATHOLOGY SCAN [[id number]] Resulted: 07/29/19 1052     Updated: 07/29/19 1053               Lab Results   Component Value Date    HGB 8.4 (L) 01/19/2018    PLT 420 (H) 01/19/2018       No results found for: NA, CL, CO2, K, CREAT, BUN, GLU    No results found for: AST, ALT, TROPI    No results found for: PT, PTT, INR    No results found for: COVID    Rads:     Radiology Results (24 Hour)     Procedure Component Value Units Date/Time    OTHER RADIOLOGY SCANS - 130560 SS [[id number]] Resulted: 07/29/19 1052    Order Status: Completed Updated: 07/29/19 1053    OTHER RADIOLOGY SCANS - 130560 SS [[id number]] Resulted: 07/29/19 1047    Order  Status: Completed Updated: 07/29/19 1047          No LMP recorded. Patient has had an ablation.      Body mass index is 40.7 kg/m.    Stop-Bang Score: 3      Date of last liquid: 07/30/19  Time of last liquid: 0700(light blue gatorade)   Date of last solid: 07/28/19               Relevant Problems   No relevant active problems       PSS Anesthesia Comments: EKG- Pending  Labs-Hgb/Hct- 10/35.9, platelets-360, A1C- 6.0; CXR- No acute process;Medical clearance- Dr. Dwain Sarna.LS        Anesthesia Plan    ASA 2     general               (Risks discussed including but not limited to:    - Neurological complications such as stroke, TIA  - Cardiovascular complications such as heart attack, Arrhythmias, Cardiac arrest   - Pulmonary complications such as Asthmatic attack,Pulm. Aspiration, Bronchospasm and Pneumonia  - Intra-operative awareness,  - Dental Injuries  - Sore Throat.  - Allergic reactions.  - Death.     Anesthesia explained and Questions answered.     Pt understands and wishes to proceed.    Kathleen Argue, MD)      intravenous induction   Detailed anesthesia plan: general endotracheal        Post op pain management: per surgeon    informed consent obtained    Plan discussed with CRNA and attending.      pertinent labs reviewed               Signed by: Kathleen Argue 07/30/19 9:51 AM

## 2019-07-30 NOTE — Plan of Care (Signed)
Problem: Safety  Goal: Patient will be free from injury during hospitalization  Outcome: Progressing  Flowsheets (Taken 07/30/2019 1818)  Patient will be free from injury during hospitalization:   Assess patient's risk for falls and implement fall prevention plan of care per policy   Provide and maintain safe environment   Use appropriate transfer methods   Ensure appropriate safety devices are available at the bedside   Include patient/ family/ care giver in decisions related to safety   Hourly rounding   Assess for patients risk for elopement and implement Elopement Risk Plan per policy   Provide alternative method of communication if needed (communication boards, writing)  Goal: Patient will be free from infection during hospitalization  Outcome: Progressing  Flowsheets (Taken 07/30/2019 1818)  Free from Infection during hospitalization:   Assess and monitor for signs and symptoms of infection   Monitor lab/diagnostic results   Monitor all insertion sites (i.e. indwelling lines, tubes, urinary catheters, and drains)   Encourage patient and family to use good hand hygiene technique     Problem: Pain  Goal: Pain at adequate level as identified by patient  Outcome: Progressing  Flowsheets (Taken 07/30/2019 1818)  Pain at adequate level as identified by patient:   Identify patient comfort function goal   Assess for risk of opioid induced respiratory depression, including snoring/sleep apnea. Alert healthcare team of risk factors identified.   Reassess pain within 30-60 minutes of any procedure/intervention, per Pain Assessment, Intervention, Reassessment (AIR) Cycle   Assess pain on admission, during daily assessment and/or before any "as needed" intervention(s)   Evaluate if patient comfort function goal is met   Offer non-pharmacological pain management interventions   Consult/collaborate with Physical Therapy, Occupational Therapy, and/or Speech Therapy   Include patient/patient care companion in decisions  related to pain management as needed     Problem: Side Effects from Pain Analgesia  Goal: Patient will experience minimal side effects of analgesic therapy  Outcome: Progressing  Flowsheets (Taken 07/30/2019 1818)  Patient will experience minimal side effects of analgesic therapy:   Monitor/assess patient's respiratory status (RR depth, effort, breath sounds)   Assess for changes in cognitive function   Prevent/manage side effects per LIP orders (i.e. nausea, vomiting, pruritus, constipation, urinary retention, etc.)   Evaluate for opioid-induced sedation with appropriate assessment tool (i.e. POSS)     Problem: Discharge Barriers  Goal: Patient will be discharged home or other facility with appropriate resources  Outcome: Progressing  Flowsheets (Taken 07/30/2019 1818)  Discharge to home or other facility with appropriate resources:   Provide appropriate patient education   Provide information on available health resources   Initiate discharge planning     Problem: Moderate/High Fall Risk Score >5  Goal: Patient will remain free of falls  Outcome: Progressing  Flowsheets (Taken 07/30/2019 1739)  High (Greater than 13):   LOW-Fall Interventions Appropriate for Low Fall Risk   LOW-Anticoagulation education for injury risk   MOD-Use of chair-pad alarm when appropriate   MOD-Consider a move closer to [employer]   MOD-Remain with patient during toileting   HIGH-Consider use of low bed   HIGH-Apply yellow "Fall Risk" arm band   HIGH-Utilize chair pad alarm for patient while in the chair   HIGH-Bed alarm on at all times while patient in bed   HIGH-Visual cue at entrance to patient's room   MOD-Use of assistive devices -Bedside Commode if appropriate     Problem: Psychosocial and Spiritual Needs  Goal: Demonstrates ability to cope  with hospitalization/illness  Outcome: Progressing  Flowsheets (Taken 07/30/2019 1818)  Demonstrates ability to cope with hospitalizations/illness: Provide quiet environment     Problem: Day  of Surgery-Gastric Bypass  Goal: Patient has stable vital signs and fluid balance  Outcome: Progressing  Flowsheets (Taken 07/30/2019 1818)  Patient has stable vital signs and fluid balance:   Monitor vital signs   Monitor/assess O2 saturation   Monitor intake and output. Notify LIP if urine output is less than 240 mL in 8 hours   Monitor lab values. Notify LIP of abnormal results.  Goal: Pain at adequate level as identified by patient  Outcome: Progressing  Flowsheets (Taken 07/30/2019 1818)  Pain at adequate level as identified by patient:   Identify patient comfort function goal   Evaluate if patient comfort function goal is met   Administer analgesics as prescribed to achieve pain goal   Assess and monitor incisional On-Q pump  Goal: Adequate oxygenation and ventilation is maintained  Outcome: Progressing  Flowsheets (Taken 07/30/2019 1818)  Adequate oxygenation and ventilation is maintained:   Monitor/assess patient's mental status and LOC   Monitor/assess patient's respiratory status (RR, depth, effort, breath sounds)   Monitor/assess O2 saturation   Position patient for maximum ventilatory efficiency, head of bed at 30 degrees   Teach/reinforce use of incentive spirometer 10 times per hour while awake, cough and deep breath as needed  Goal: Mobility/activity is maintained at optimum level for patient  Outcome: Progressing  Flowsheets (Taken 07/30/2019 1818)  Mobility/activity is maintained at optimum level for patient:   Out of bed to chair with assistance   Supervised progressive ambulation (bed/chair exercises, ambulate in hall 3-4 x a day, BSC, or BRP)   VTE Prevention: administer anticoagulant(s) and/or apply anti-embolism stockings/devices as ordered   Teach/review/reinforce ankle pump exercises  Goal: Nutritional intake is adequate  Outcome: Progressing  Flowsheets (Taken 07/30/2019 1818)  Nutritional intake is adequate:   NPO   Assess GI status (bowel sounds, nausea/vomiting, distention, flatus)    Monitor intake and output  Goal: Patient/patient care companion demonstrates understanding of disease process, treatment plan, medications, and discharge plans  Outcome: Progressing  Flowsheets (Taken 07/30/2019 1818)  Patient/patient care companion demonstrates understanding of surgery/treatment plan, medication, and discharge plans:   Involve the patient's family/support person regarding discharge planning process   Provide/review education on patient's medications, medication side effects, diet restrictions, and post-operative management to patient/patient care companion

## 2019-07-30 NOTE — Op Note (Signed)
Procedure Date: 07/30/2019     Patient Type: V     SURGEON: Lidia Collum MD  ASSISTANT:  Particia Lather Halmi MD     PREOPERATIVE DIAGNOSES:  1.  Morbid obesity and associated comorbidities, body mass index is 41.    2.  Reflux.  3.  Status post laparoscopic adjustable gastric band.     POSTOPERATIVE DIAGNOSES:  1.  Morbid obesity and associated comorbidities, body mass index is 41.    2.  Reflux.  3.  Status post laparoscopic adjustable gastric band.     TITLE OF PROCEDURES:  1.  Laparoscopic vertical sleeve gastrectomy.  2.  Laparoscopic gastric band and residual removal.  3.  Laparoscopic hiatal hernia repair.  4.  Laparoscopic lysis of adhesion.  5.  Esophagogastroduodenoscopy.  6.  Insertion of double-lumen On-Q pain pump.     ANESTHESIA:  General endotracheal anesthesia.     INTRAVENOUS FLUIDS:  1800 mL crystalloid.     ESTIMATED BLOOD LOSS:  10 mL.     COMPLICATIONS:  None.     SPECIMENS:  1.  Portion of the stomach.  2.  Gastric band and residual.     FINDINGS:  1.  Intra-abdominal adhesion.  2.  Hiatal hernia.     DISPOSITION:  Transferred to recovery room.       INDICATIONS FOR PROCEDURE:  This is a very pleasant 38 year old female, morbidly obese, who underwent  laparoscopic adjustable gastric band placement many years ago.  She was  suffering from the reflux in addition of some weight regain.  She was  interested for removing the band to resolve her reflux, as well as undergo  revision surgery for further weight loss.  She was advised about the risk  of the procedure including but not limited to infection, bleeding, injury  to adjacent structures, injury to solid organs, injury to hollow viscus,  deep vein thrombosis, pulmonary embolism, myocardial infarction, abscess  formation, stroke, myocardial infarction, leakage, and even death was  discussed with the patient in detail.  In addition, the long-term  complications such as stricture, ulceration, food intolerance, meaningless  weight loss, recurrent for the  supplement taken, etc. was discussed in  detail.  She fully understood and agreed with the plan.  Informed consent  was obtained.     DESCRIPTION OF PROCEDURE:  The patient was identified by myself in preoperative area, taken back to  the operating room.  She was placed on the OR table in supine position.   Adequate sedation was initiated by department of anesthesiology and  subsequently she became intubated.  The entire abdomen and pelvis was  prepped and draped in sterile fashion.  Proper patient identification was  performed.  Left upper quadrant subcostal margin and midclavicular line was  infiltrated by using 1% lidocaine and 0.5% Marcaine.  A 0.5 cm incision was  created by the knife and Veress needle was inserted without difficulty.   The abdomen was insufflated to a pressure of 15 mmHg by using CO2 gas.   Subsequently, a 5-mm trocar was placed and the scope was placed through the  trocar.  There was no evidence of any iatrogenic injury.  Under direct  visualization, a 12-mm trocar was placed in supraumbilical, which was used  as a camera port.  The 5 and 12-mm trocar was placed in the patient's right  side.  The last 5-mm trocar was placed in the patient's right anterior  axillary line.  FastClamp liver retractor was introduced to the  abdominal  cavity and the left lobe of the liver was elevated partially from the  stomach.  At this time, the patient was positioned in steep reverse  Trendelenburg.  Our attention was turned to the upper aspect of the  stomach.  There was a significant adhesion between the left lobe of the  liver and the anterior aspect of the stomach, especially around the band  area and the hiatal area.  Dissection was taken down by using Harmonic  scalpel as well as blunt dissection or laparoscopic scissors.   Subsequently, we were able to completely identify the hiatal area and  release it from the left lobe of the liver as well as the anterior aspect  of the stomach was completely lysed  from the omentum to the tube of the  banding and also the left lobe of the liver.  Please note that the entire  lysis of adhesions took over 65 minutes.  At this point, attention was  turned to the band.  The band was identified and all the surrounding  tissues including the adhesions in the pelvis were completely taken down.   The buckle of the band was completely freed up, the partially opened tunnel  of the band toward the left side and subsequently the band was completely  opened, tube was cut and the band was removed from around the stomach.  A  lot more dissection was performed in the lateral aspect and subsequently  the tunnel was completely opened.  Of note, the part of the fundus that was  completely obstructing the left crus was taken down.  The stomach was  completely flat out and all the adhesion was taken down very nicely.  There  was a very big portion of the fatty tissue as well as the scar tissue of  the pseudocapsule located in the anterior aspect of the stomach, which was  identified and dissected by using Harmonic scalpel and removed from the  abdominal cavity.  At this time, the band and the part of the tube  connected to the scope was removed from the abdominal cavity uneventfully.   Once again, the stomach was completely flat out.  All this adhesion was  taken down, so the capsule was removed and the tunnel was completely  opened.     At this point, attention was turned to the hiatal area.  The right crus was  identified and was dissected from all the surrounding tissues including the  esophagus.  The esophagus was circumferentially mobilized.  Subsequently,  we were able to pull the esophagus down to the abdominal cavity at least 4  cm.  Right and left crus were completely dissected from all the surrounding  tissues including the previous scar tissue and also the pseudocapsule.  By  using 0 silk the stitch and interrupted stitches, 3 interrupted stitches  were placed in the posterior aspect of  the esophagus.  There was no  evidence of any angulation or stricture of the esophagus was appreciated.   Hemostasis was excellent at this point.     At this point, attention was turned to the stomach.  The previous  orogastric tube was utilized and the stomach was completely decompressed.   Pylorus was identified and almost 2 to 3 cm from the pylorus was measured.   By using Harmonic scalpel, the pylorus and the greater curvature was  completely dissected from the omentum.  This dissection was continued in  the mid portion of the stomach, subsequently  toward the short gastrics and  the fundus area.  The stomach was completely freed up and the left crus  were achieved and identified without difficulty.  Of note, there was no  adhesion in the posterior aspect of the stomach to the pancreas, which was  taken down uneventfully.  A 36-French size bougie was placed and stapling  of the stomach was initiated by using a power Covidien stapling device.  A  total of 6 purple color cartridges, which each one was 60 mm was utilized.   Please note that the entire stapling was performed along with the 36-French  bougie size.  Please note that the bougie was utilized mostly for guiding  and not for sizing.  At the end of the stapling, the bougie was removed  uneventfully.  The stapler was completely inspected.  It was completely  regular and intact without any abnormality.  The entire septum was  implicated by using #2-0 Surgidac running stitch from the short gastric  cardia and fundus area to the prepyloric area.  Hemostasis was excellent at  this point.     Olympus gastroscope was inserted through oral cavity and was advanced under  direct vision into esophagus.  The entire esophagus, upper, middle and  lower aspect was inspected.  No evidence of any abnormality was  appreciated.  Hemostasis was excellent.  A scope was advanced to the  gastric sleeve without difficulty.  The sleeve was found to be completely  straight.  No  evidence of any abnormality was appreciated.  Prepyloric area  was achieved without difficulty and after applying the cricoid pressure and  distal pressure, air leak test was performed and no evidence of any air  leak was appreciated.  After decompression of the sleeve, the scope was  removed uneventfully.      Right upper quadrant trocar site was utilized for the On-Q placement.  The  first one was placed in the right upper quadrant subcostal margin and  preperitoneal space and second was placed in the left upper quadrant  subcostal margin and preperitoneal space.     Tisseel 4 mL was applied at the entirety of the staple line from the fundus  to prepyloric area.  Specimen, which was portion of the stomach as well as  the band and the connector tube was removed from the abdominal cavity.   Right side trocar site fascial defect was approximated by using 0 PDS and  Endoclosure device.  All the trocars were removed under direct  visualization and abdomen was deflated.  All the skin wounds were  irrigated.  At this point, the port, which was located in the right side of  the abdomen, we made an almost 4 cm incision on the top.  The incision was  carried down to subcutaneous tissue.  Subsequently, the pseudocapsule on  top of the port was opened and the port and the leftover tube which was  connected to the port was dissected from the fascia and removed completely.   Part of the pseudocapsule also was removed.  The subcutaneous tissue had  excellent hemostasis.  The subcutaneous tissue was approximated by using  3-0 Vicryl.  All skin wounds were approximated by using 4-0 Biosyn  subcuticular stitch.  Half-inch strips and Band-Aids were applied on top of  the wound.  At the end of the operation, a count of surgical instruments,  sponges and needles were correct.  The patient was able to tolerate the  procedure well.  She  was awakened and extubated in the operating room and  transferred to recovery room in stable  condition with no complication.           D:  07/30/2019 14:38 PM by Dr. Lidia Collum, MD ([id number])  T:  07/30/2019 14:58 PM by NTS      (Conf: [id number]) (Doc ID: [id number])

## 2019-07-30 NOTE — Progress Notes (Signed)
A/Ox4. VSS. Fall risk precautions in place. Bed in locked and low position. Call bell and bedside table within reach. PRN dilaudid for pain control. No nausea. Lap sites with bandaids c/d/i. OnQ in place. OOB walking in hallway. DTV @ 8. Attempted to void at 1700, BS 130s. Will continue to monitor.

## 2019-07-30 NOTE — Transfer of Care (Signed)
Anesthesia Transfer of Care Note    Patient: Caitlin Morse    Procedures performed: Procedure(s):  LAPAROSCOPIC, SLEEVE GASTRECTOMY, CONVERSION FROM LAP BAND, HIATAL HERNIA REPAIR, LYSIS OF ADHESIONS  EGD  INSERTION, PAIN PUMP (MEDICAL)    Anesthesia type: General ETT    Patient location:Phase I PACU    Last vitals:   Vitals:    07/30/19 1442   BP: 140/83   Pulse: 66   Resp: 16   Temp: 36.5 C (97.7 F)   SpO2: 100%       Post pain: Patient not complaining of pain, continue current therapy      Mental Status:awake    Respiratory Function: tolerating face mask    Cardiovascular: stable    Nausea/Vomiting: patient not complaining of nausea or vomiting    Hydration Status: adequate    Post assessment: no apparent anesthetic complications and no reportable events    Signed by: Esmeralda Arthur  07/30/19 2:43 PM

## 2019-07-30 NOTE — Brief Op Note (Signed)
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 Item Serial No. Manufacturer Lot No. LRB No. Used Action   CATH ON-Q SILVER EXPNSN 12.5CM - [id number] Catheter CATH ON-Q SILVER EXPNSN 12.5CM  [employer] [id number] N/A 2 Implanted       Drains:   Drains: no    Specimens:       Findings:   Intra abdominal adhesion  Gastric band  Hiatal hernia    Complications:   None      Signed by: Lidia Collum, MD

## 2019-07-30 NOTE — Plan of Care (Addendum)
Pt became upset at approximately 0350 due to back pain. Pt had been taking prn dilaudid with good affect. This RN and RN Anne Hahn had recently been in patients room 10 minutes earlier to change OnQ dressing, and patient stated she was feeling much better and felt her pain was managed well. This RN told patient would review her prn orders as was not sure what she has due, and in the meantime offered to help adjust and reposition which was done. After leaving pt rooms, pt called CT and told her she wanted to leave immediately. RN venice at bedside, assisted in Education officer, museum, RN handoff to [place].         Problem: Day of Surgery-Gastric Bypass  Goal: Patient has stable vital signs and fluid balance  Outcome: Progressing  Flowsheets (Taken 07/30/2019 1818 by Phylliss Blakes, RN)  Patient has stable vital signs and fluid balance:   Monitor vital signs   Monitor/assess O2 saturation   Monitor intake and output. Notify LIP if urine output is less than 240 mL in 8 hours   Monitor lab values. Notify LIP of abnormal results.  Goal: Pain at adequate level as identified by patient  Outcome: Progressing  Flowsheets (Taken 07/30/2019 1818 by Phylliss Blakes, RN)  Pain at adequate level as identified by patient:   Identify patient comfort function goal   Evaluate if patient comfort function goal is met   Administer analgesics as prescribed to achieve pain goal   Assess and monitor incisional On-Q pump  Goal: Adequate oxygenation and ventilation is maintained  Outcome: Progressing  Flowsheets (Taken 07/30/2019 1818 by Phylliss Blakes, RN)  Adequate oxygenation and ventilation is maintained:   Monitor/assess patient's mental status and LOC   Monitor/assess patient's respiratory status (RR, depth, effort, breath sounds)   Monitor/assess O2 saturation   Position patient for maximum ventilatory efficiency, head of bed at 30 degrees   Teach/reinforce use of incentive spirometer 10 times per hour while awake, cough  and deep breath as needed  Goal: Mobility/activity is maintained at optimum level for patient  Outcome: Progressing  Flowsheets (Taken 07/30/2019 1818 by Phylliss Blakes, RN)  Mobility/activity is maintained at optimum level for patient:   Out of bed to chair with assistance   Supervised progressive ambulation (bed/chair exercises, ambulate in hall 3-4 x a day, BSC, or BRP)   VTE Prevention: administer anticoagulant(s) and/or apply anti-embolism stockings/devices as ordered   Teach/review/reinforce ankle pump exercises

## 2019-07-30 NOTE — Anesthesia Postprocedure Evaluation (Signed)
Anesthesia Post Evaluation    Patient: Caitlin Morse    Procedure(s):  LAPAROSCOPIC, SLEEVE GASTRECTOMY, CONVERSION FROM LAP BAND, HIATAL HERNIA REPAIR, LYSIS OF ADHESIONS  EGD  INSERTION, PAIN PUMP (MEDICAL)    Anesthesia type: general    Last Vitals:   Vitals Value Taken Time   BP 146/85 07/30/19 1620   Temp 36.5 C (97.7 F) 07/30/19 1442   Pulse 63 07/30/19 1620   Resp 20 07/30/19 1620   SpO2 100 % 07/30/19 1620                 Anesthesia Post Evaluation:     Patient Evaluated: PACU  Patient Participation: complete - patient participated  Level of Consciousness: awake and alert  Pain Score: 0  Pain Management: adequate    Airway Patency: patent    Anesthetic complications: No      PONV Status: none    Cardiovascular status: acceptable  Respiratory status: acceptable  Hydration status: acceptable        Signed by: Kathleen Argue, 07/30/2019 5:19 PM

## 2019-07-31 ENCOUNTER — Inpatient Hospital Stay: Payer: BC Managed Care – PPO

## 2019-07-31 ENCOUNTER — Encounter: Payer: Self-pay | Admitting: Surgery

## 2019-07-31 LAB — CBC
Absolute NRBC: 0 10*3/uL (ref 0.00–0.00)
Hematocrit: 32.2 % — ABNORMAL LOW (ref 34.7–43.7)
Hgb: 8.9 g/dL — ABNORMAL LOW (ref 11.4–14.8)
MCH: 17.8 pg — ABNORMAL LOW (ref 25.1–33.5)
MCHC: 27.6 g/dL — ABNORMAL LOW (ref 31.5–35.8)
MCV: 64.4 fL — ABNORMAL LOW (ref 78.0–96.0)
Nucleated RBC: 0 /100 WBC (ref 0.0–0.0)
Platelets: 373 10*3/uL — ABNORMAL HIGH (ref 142–346)
RBC: 5 10*6/uL (ref 3.90–5.10)
RDW: 20 % — ABNORMAL HIGH (ref 11–15)
WBC: 12.75 10*3/uL — ABNORMAL HIGH (ref 3.10–9.50)

## 2019-07-31 LAB — BASIC METABOLIC PANEL
Anion Gap: 7 (ref 5.0–15.0)
BUN: 6 mg/dL — ABNORMAL LOW (ref 7–19)
CO2: 26 mEq/L (ref 22–29)
Calcium: 8.9 mg/dL (ref 8.5–10.5)
Chloride: 101 mEq/L (ref 100–111)
Creatinine: 0.6 mg/dL (ref 0.6–1.0)
Glucose: 108 mg/dL — ABNORMAL HIGH (ref 70–100)
Potassium: 4.6 mEq/L (ref 3.5–5.1)
Sodium: 134 mEq/L — ABNORMAL LOW (ref 136–145)

## 2019-07-31 LAB — GFR: EGFR: >60

## 2019-07-31 MED ORDER — ENOXAPARIN SODIUM 40 MG/0.4ML SC SOLN
40.00 mg | Freq: Every day | SUBCUTANEOUS | 0 refills | Status: DC
Start: 2019-08-01 — End: 2020-08-17

## 2019-07-31 MED ORDER — HYDROCODONE-ACETAMINOPHEN 7.5-325 MG/15ML PO SOLN
10.00 mL | Freq: Four times a day (QID) | ORAL | 0 refills | Status: AC | PRN
Start: 2019-07-31 — End: 2019-08-07

## 2019-07-31 MED ORDER — IOHEXOL 350 MG/ML IV SOLN
100.00 mL | Freq: Once | INTRAVENOUS | Status: AC | PRN
Start: 2019-07-31 — End: 2019-07-31
  Administered 2019-07-31: 09:00:00 100 mL via ORAL

## 2019-07-31 MED ORDER — ONDANSETRON 4 MG PO TBDP
4.00 mg | ORAL_TABLET | Freq: Three times a day (TID) | ORAL | 0 refills | Status: DC | PRN
Start: 2019-07-31 — End: 2020-08-17

## 2019-07-31 NOTE — Progress Notes (Signed)
Patient interviewed and denies any anesthesia complications such as nausea, vomiting or sore throat.  + voiding

## 2019-07-31 NOTE — Plan of Care (Signed)
All goals completed adequately for discharge.    Problem: Safety  Goal: Patient will be free from injury during hospitalization  Outcome: Adequate for Discharge  Goal: Patient will be free from infection during hospitalization  Outcome: Adequate for Discharge     Problem: Pain  Goal: Pain at adequate level as identified by patient  Outcome: Adequate for Discharge     Problem: Side Effects from Pain Analgesia  Goal: Patient will experience minimal side effects of analgesic therapy  Outcome: Adequate for Discharge     Problem: Discharge Barriers  Goal: Patient will be discharged home or other facility with appropriate resources  Outcome: Adequate for Discharge     Problem: Psychosocial and Spiritual Needs  Goal: Demonstrates ability to cope with hospitalization/illness  Outcome: Adequate for Discharge     Problem: Moderate/High Fall Risk Score >5  Goal: Patient will remain free of falls  Outcome: Adequate for Discharge     Problem: Day of Surgery-Gastric Bypass  Goal: Patient has stable vital signs and fluid balance  Outcome: Adequate for Discharge  Goal: Pain at adequate level as identified by patient  Outcome: Adequate for Discharge  Goal: Adequate oxygenation and ventilation is maintained  Outcome: Adequate for Discharge  Goal: Mobility/activity is maintained at optimum level for patient  Outcome: Adequate for Discharge  Goal: Nutritional intake is adequate  Outcome: Adequate for Discharge  Goal: Patient/patient care companion demonstrates understanding of disease process, treatment plan, medications, and discharge plans  Outcome: Adequate for Discharge

## 2019-07-31 NOTE — Progress Notes (Signed)
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 Patient is independent at home. Her husband and kids will provide support at discharge. She has no identified needs at this time.  Discharge Barriers identified upon initial assessment: none   Recommendation   Possible post-hospitalization need based on patient's GOC and treatment plan:       Home with family support.               CM will continue to monitor patient's progression of care, d/c needs and possible barriers to discharge.       Max Fickle RN BSN CMSRN   Case Manager ext [phone]

## 2019-07-31 NOTE — Progress Notes (Signed)
Bariatric Nurse Program Coordinator Inpatient Note    Surgery Type: Laparoscopic Sleeve Gastrectomy   Post Operative Day #: 1    Patient is experiencing adequate pain control and understands available pain control modalities : Yes    Patient encouraged to use incentive spirometer ten times an hour while awake: Yes     Patient to pump feet while in bed to minimize the risk of VTE, wearing compression wraps as ordered: Yes    Patient instructed to ambulate at least 4 times per day on unit: Yes    Patient aware to gradually increase activity upon discharge: Yes    Patient has completed pre-operative education bundle, including watching videos and completing quiz. Patient verbalizes understanding of the life style changes after surgery: Yes    Encouraged the patient to attend bariatric support group meetings regularly upon discharge: Yes    Advised to follow up with the surgeon's office as directed: Yes    Notes: Patient alert, questions answered. Provided information on On Qpump, including removal instructions. Patient verbalized understanding.

## 2019-07-31 NOTE — Discharge Summary (Signed)
[facility]  Lidia Collum, MD, [place], [place]   [id number], [address]  [place], [place] [address]  Jani Files; [phone]  Fax: [phone]     Admit Date: 07/30/2019  Discharge Date: 07/31/2019      Patient ID:  Caitlin Morse  38 y.o.  1980-09-25    Admission Diagnosis:   Morbid obesity with co-morbid conditions  Discharge Diagnosis:  Morbid obesity with co-morbid conditions    @PTMEDDISCHARGE @    Operative Procedures: Laparoscopic adjustable gastric band removal, laparoscopic vertical sleeve gastrectomy, laparoscopy hiatal hernia repair, laparoscopic lysis of adhesion, esophagogastroduodenoscopy, insertion of double-lumen On-Q pain pump      Hospital Course: Admitted to the Surgical Unit after the bariatric procedure with stable vital signs. Remained stable and comfortable with IV fluids analgesics and antiemetics. On the first postoperative day an Upper GI X-ray was done which was negative for obstruction or leak. Patient was started on clear liquid diet, encouraged to ambulate and to use the Incentive Spirometer.  The hospital course was uneventful, Patient progressed to satisfactory PO intake and good pain control.    Physical Exam on Discharge:  BP 136/84    Pulse 64    Temp 98.6 F (37 C) (Oral)    Resp 18    Ht 1.651 m (5\' 5" )    Wt 110.9 kg (244 lb 9.6 oz)    SpO2 98%    BMI 40.70 kg/m     Physical Examination: General appearance - alert, well appearing, and in no distress  Chest - clear to auscultation, no wheezes, rales or rhonchi, symmetric air entry  Heart - normal rate, regular rhythm, normal S1, S2, no murmurs, rubs, clicks or gallops  Abdomen - soft, nontender, nondistended, no masses or organomegaly  Neurological - alert, oriented, normal speech, no focal findings or movement disorder noted  Extremities - peripheral pulses normal, no pedal edema, no clubbing or cyanosis        Most Recent BMP and CBC:    Recent Labs     07/31/19  0445   BUN 6*   Creatinine 0.6   Sodium 134*   CO2 26      Recent Labs     07/31/19  0445   WBC 12.75*   RBC 5.00   Hematocrit 32.2*   MCV 64.4*   MCH 17.8*   MCHC 27.6*   RDW 20*         Condition at discharge: Stable    Disposition:  Home      Lidia Collum  NP      PCP:  Etta Grandchild, MD

## 2019-07-31 NOTE — Nursing Progress Note (Signed)
Called by [facility] RN at [phone] AM patient upset and reported that she wanted to leave AMA because of uncontrolled pain and back pain. Had previously been offered IV dilaudid prn with moderate relief to surgical pain but still having complaints of back pain. Upon arrival to room patient very upset about current pain regimen was attempting to clarify situation with [facility], but patient did not want bedside RN in room at time. Spoke with Teaching laboratory technician to clarify situation and sat with patient in room to gain further understanding of what was going on. Patients back pain was contributed to sitting in bed, and from laying during surgery. Offered assistance in repositioning and moving from bed to chair to sit, offered to ambulate around unit but patient refused. Dr. Farrell Ours called and notified about uncontrolled back pain, recommended pain patch or muscle relaxer but unable to administer at this time due to patients current NPO status and need for upper gi study this AM. Received order to apply heat packs to back area. Applied heat packs to patients affected area and prn IV dilaudid given with good relief, patient reassessed with pain of 2/10, scheduled Toradol given. Patient currently resting now, handoff report given to [place] at bedside.

## 2019-07-31 NOTE — Progress Notes (Signed)
Admit Date: 07/30/2019    Assessments/Plans:     1. Stabile post bariatric surgery  2. Bariatric clear liquid diet   3. Ambulate, Incentive Spirometry  4. Continue Lovenox  5. Discharge home today      Subjective:      Feels well, intermittent nausea, mild incisional pain. Denies shortness of breath or chest  pain.      Objective:     BP 136/84    Pulse 64    Temp 98.6 F (37 C) (Oral)    Resp 18    Ht 1.651 m (5\' 5" )    Wt 110.9 kg (244 lb 9.6 oz)    SpO2 98%    BMI 40.70 kg/m    Temp (24hrs), Avg:98 F (36.7 C), Min:97.3 F (36.3 C), Max:98.6 F (37 C)      GENERAL: Alert, oriented, comfortable  HEENT: Sclera anicteric,     Neck: Supple  Lungs: Clear to auscultation   Heart: Regular rhythm, no murmur, gallops, or rubs  Abdomen: Soft, non-disteneded. Incisions clan and dry.    Extremities: No edema or erythema, no calf tenderness  Neuro: No sensory or motor deficit      CBC With Diff   Lab Results   Component Value Date/Time    WBC 12.75 (H) 07/31/2019 04:45 AM    RBC 5.00 07/31/2019 04:45 AM    HCT 32.2 (L) 07/31/2019 04:45 AM    MCV 64.4 (L) 07/31/2019 04:45 AM    MCH 17.8 (L) 07/31/2019 04:45 AM    MCHC 27.6 (L) 07/31/2019 04:45 AM    RDW 20 (H) 07/31/2019 04:45 AM    MPV Unmeasured 07/31/2019 04:45 AM    No results found for: BANDS, LYMPHOCYTES, EOS         Basic Metabolic Profile   Recent Labs     07/31/19  0445   Sodium 134*   CO2 26   BUN 6*   Creatinine 0.6         Microbiology   @CSBLD @  @MICROIP @     Radiology:  UGI negative for obstruction or leak    Anysia Choi

## 2019-07-31 NOTE — Discharge Instr - AVS First Page (Addendum)
Post Bariatric Surgery Discharge Instructions    For Questions Call Your Surgeon    Reasons to call the doctor:  Call your doctor or go to emergency room if you have any of the following:   Fever greater than 101.5 F   Liquid draining from your wound   Redness around your wound that is spreading   Pain not controlled with medications   Chest pain   Shortness of breath   Leg pain   Severe nausea/vomiting not controlled with medication    Diet:   Sugar Free Clear Liquids/Protein Drinks/Supplements after surgery  Please refer to binder given to you BEFORE surgery for diet discharge instructions.    Activity:   You may walk as much as you like and go up and down stairs.  We ask that you not do any heavy lifting (greater than 20 lbs)    Driving:  You may drive when you are no longer taking narcotic pain medication and you feel mobile enough to turn your body to look behind you safely without causing pain.    Pain Control:  Some pain and swelling is normal after surgery.  You may take the pain medication as prescribed.  You may also ice your incision with an ice pack wrapped in a towel for 20 min as needed.    Bathing:    You may shower 24 hours after surgery.  You should not soak in a bath tub or go swimming for 2 weeks. Your wound is covered with surgical glue.  It is ok to shower with glue in place, let soaping water run over the wound and pat dry.  The glue will fall off on its own.  You may also shower with the OnQ catheter in place as long as the clear dressing is intact.     OnQ CATHETER REMOVAL  Remove catheter as soon as infusion is complete  (ball will be empty approximately 5 days)    1. Remove dressing and loosen the Steri-Strips at catheter site.  2. Grasp catheter close to skin and gently pull to remove. The catheter should be easy to remove and not painful. Do not tug or quickly pull on catheter during removal.   CAUTIONS:  . If resistance is encountered or catheter stretches, STOP. Continued  pulling could break the catheter. It's advisable to wait 30 to 60 minutes and try again.  . Do not cut or forcefully remove catheter.  . After removal, check distal end of catheter for black marking to ensure entire catheter was removed.  3. Cover puncture site with bandaid.  4. Discard catheter

## 2019-07-31 NOTE — Consults (Signed)
**Note De-Identified  Obfuscation** Inpatient Bariatric Nutrition Education       Surgery Type: Sleeve  Surgery Date: 07/30/19    Nutrition education done by surgeon's RD pre-operatively: Yes    Reinforced nutritional guidelines: Yes    Instructed patient on adequate fluid intake with goal of drinking 64 ounces/day: Yes    Patient verbalized understanding of diet progression: Yes    Patient has home supply of protein supplements: Yes    Patient has home supply of vitamins and minerals: No    Going Home Nutrition information reviewed with patient: Yes    Educational materials/handouts given to patient: Yes    Patient verbalized comprehension of bariatric nutrition guidelines to follow upon discharge: Yes    Notes: Questions answered.

## 2019-07-31 NOTE — Discharge Summary -  Nursing (Signed)
Discussed discharge instructions with pt, she verbalized understanding. Pt knows when and how to take all medications at home as prescribed. Prescriptions were delivered to pt room from [facility]. Pt knows how to self-administer lovenox at home. Husband was present for teaching as well. All questions were answered and pt knows where to call with any further concerns. IV removed, catheter intact. Pt left floor via wheelchair to lobby with no incident.

## 2019-08-02 NOTE — UM Notes (Signed)
PATIENT NAME: Caitlin Morse, Caitlin Morse   DOB: 05/22/1981   PMH:   Past Medical History:   Diagnosis Date    Anemia 01/12/2018    Hgb 6.7    Anxiety     Depression     Not on a medication right now.     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   20mg  iv pepcid  Q12h  ivf 150cc/h. .   1mg  iv dilaudid x3.         11/25     Feels well, intermittent nausea, mild incisional pain.       1. Stabile post bariatric surgery  2. Bariatric clear liquid diet   3. Ambulate, Incentive Spirometry  4. Continue Lovenox  5. Discharge home today      1g iv tylenol x2.   20mg  iv pepcid  Q12h  ivf 150cc/h. .   1mg  iv dilaudid x2.   30mg  iv toradol x1.   15mg  iv toradol x1.            ** This review is compiled from documentation provided by the treatment team within the patient's medical record. **       Wynell Balloon RN CM  [facility]  [phone]  NPI: [id number]  Tax ID: [id number]  Phone: [phone]  Fax: [phone]     Please use fax number [phone] to provide authorization for hospital services or to request additional information.

## 2019-09-26 MED FILL — Gabapentin Oral Soln 250 MG/5ML: ORAL | Qty: 6 | Status: AC

## 2020-06-30 ENCOUNTER — Telehealth: Payer: PRIVATE HEALTH INSURANCE

## 2020-06-30 ENCOUNTER — Encounter: Payer: Self-pay | Admitting: Plastic Surgery

## 2020-06-30 NOTE — Pre-Procedure Instructions (Addendum)
•   Anesthesia Guidelines:  o None (Hx anemia resolved)     Surgeon Requirement:  o PCP pre-op clr, lab, EKG     Specialist Notes / Test Results / Records Requested:  o None     Future Plan / Upcoming Appts:   o 07/02/20 PCP appt for eval/clr, lab, EKG, pt will have result faxed to [phone]     Recent Hospitalization / ED Visit:   o None     Nav Team / Nav 1 Handoff:  o Pt had + COVID > 2 weeks ago, she said she tested negative on 06/21/20, was exposed again to COVID the next day on 06/22/20 (from her children).  She has moved out of her house since 06/22/20 and denies of COVID s/sx.     Email Sent To:   o Email sent to Nav team re above note  o Hospital map/NPO instruction emailed to patient at [email or web address]     Other / Misc:   o Email sent to pain team re medical Marijuana use.  Patient was instructed to stop 7 days prior to surgery     Labs / Testing at [facility]:  o None

## 2020-07-08 NOTE — Pre-Procedure Instructions (Signed)
·   Pt not cleared for surgery per 10/28 PCP pre op.   Called surg office. They are aware and will cxl surgery

## 2020-07-10 ENCOUNTER — Encounter: Admission: RE | Payer: Self-pay | Source: Ambulatory Visit

## 2020-07-10 SURGERY — ABDOMINOPLASTY, (COSMETIC)
Anesthesia: General | Site: Abdomen

## 2020-08-17 ENCOUNTER — Encounter: Payer: Self-pay | Admitting: Plastic Surgery

## 2020-08-17 ENCOUNTER — Ambulatory Visit: Payer: PRIVATE HEALTH INSURANCE | Attending: Plastic Surgery

## 2020-08-17 NOTE — Pre-Procedure Instructions (Signed)
Surgical Risk Level : (Low, Intermediate, High)  o Intermediate     Surgeon Testing Requirements:  o No order in Epic-per pt requested by surgeon medical clearance and lab -07/2020 not cleared due to anemia- Pt not asked for an updated medical clearance  o Following w/hematology pending clearance 08/31/20     Anesthesia Guideline Requirements:  o CBC within 30 days     Specialist Notes / Test Results / Records Requested:  o na     Recent Hospitalization / ED Visit:   o na     Future Plan / Upcoming Appts:   o 08/31/20 final hematology clearance     Labs/Testing @ [facility] PSS:   o na   Bipap/Cpap usage: Encourage to use hospital OSA machine   Use Hospital OSA machine-na      Use Own OSA machine-na  Settings if known:na     Email Sent To Patient:   o Provided PSS email [email or web address] or phone [phone] to patient/family member   o Emailed hibiclens inst [email or web address]     NPO Instructions given to patient:    o NPO instructions reviewed: Clear liquids up to 2 hours prior to arrival time, then NPO. No solid food 8 hours prior to scheduled procedure time. Examples of clear liquids include water, apple juice, sports drinks such as Gatorade, coffee or tea without milk or cream. Sugar or sweetener may be added  o Fasting Requirements per Preoperative Fasting Guidelines for Elective Surgeries and Procedures Requiring Anesthesia Policy Revised 01/2020.  Ingested material Fasting requirement   Clear liquids/Ice Chips 2 hours prior to arrival time   Breast milk 4 hours prior to scheduled procedure time   Infant formula 6 hours prior to scheduled procedure time   Non-human milk 8 hours prior to scheduled procedure time   Solid food 8 hours prior to scheduled procedure time     o Bowel prep instructions (if applicable): na     Faxes Sent To:   o Pharmacy- DOS medication orders (if applicable)  na  o Faxed surgeon for EKG w/date final hematology clearance, cbc within 30 days and H&P     Epic Orders  Entered:   o Preop Nursing Anesthesia Orders  o      Other Outlying information gathered that does not fit anywhere else  o na     Chart Room Handoff for Further  Follow-up if Applicable:  o Faxed surgeon for H&P final hematology clearance, cbc within 30 days (recent iron infusion) EKG with date    Visitor Restriction Guidelines per [facility] as of 03/23/20:  All visitors must adhere to the following:    Exhibit no COVID-19 symptoms    Age 56+ (internally exceptions can be made by administrative team as needed, e.g., siblings, end of life situations, etc.)    In keeping with the CDCs current guidance, regardless of vaccination status, everyone in a healthcare facility must wear a mask covering their mouth and nose the entire time they are in the facility. Visitors who fail to wear a mask properly will be asked to leave.    The following face coverings cannot be worn at any  location: gaiter style masks, bandanas or vented masks.    No visitors are allowed for patients with suspected or confirmed COVID-19, except in end-of-life situations.  Hospital Inpatient:   Visitation hours: 9 a.m. - 6:30 p.m. daily    Adult patients may have two visitors, in addition to a Proofreader (  DSP), if applicable    Pediatric patients may have two parents/guardians at bedside 24/7. For pediatric outpatient areas, only parents/guardians may visit  Outpatient/Ambulatory Surgery:    Adult patients may have one visitor, in addition to their Designated Support Person (DSP) on the day of surgery.    No family members will be allowed into Phase 1 recovery areas. Physicians will call contact person to give report of procedure.    Family / visitor will be called by PACU staff  to review discharge instructions via phone and answer any questions.     Advise to call surgeon if need to cancel surgery arises.      Patient verbalized understanding and acceptance of above information.

## 2020-09-08 NOTE — Pre-Procedure Instructions (Signed)
Called Hematology Dr. Darliss Ridgel [phone] for clearance and any recent labs. Office will fax now to [phone],     Per note on EKG- no recent EKG on file since 07/2019.

## 2020-09-09 NOTE — Pre-Procedure Instructions (Signed)
Received preop labs from hematologist but not office note. Called Dr Schuyler Amor office [phone] and spoke w/Omara who states that she will fax to [phone].

## 2020-09-14 NOTE — Anesthesia Preprocedure Evaluation (Addendum)
Anesthesia Evaluation    AIRWAY    Mallampati: II    TM distance: >3 FB  Neck ROM: full  Mouth Opening:full  Planned to use difficult airway equipment: No CARDIOVASCULAR    cardiovascular exam normal       DENTAL    no notable dental hx     PULMONARY    pulmonary exam normal     OTHER FINDINGS                  Relevant Problems   No relevant active problems       PSS Anesthesia Comments: Anemia, Anxiety;Labs- WNL; EKG-SR, SA; Hematology clearance- Dr. Darliss Ridgel; Medical clearance-Dr. Dwain Sarna.LS        Anesthesia Plan    ASA 2     general               (Risks and benefits with the common expectations discussed and patient wishes to proceed.)      intravenous induction   Detailed anesthesia plan: general endotracheal        Post op pain management: per surgeon    informed consent obtained    Plan discussed with CRNA.      pertinent labs reviewed             Signed by: Helaine Chess, NP 09/14/20 9:22 AM

## 2020-10-01 NOTE — Pre-Procedure Instructions (Signed)
Called patient to ask if Potassium level was rechecked since 08/31/2020.   Patient states she will see her PCP at 3:30 pm this afternoon to get medical clearance, EKG, CBC, PT/PTT.   Patient instructed to also get BMP to recheck her potassium level.   Patient will call her surgeon to send BMP order to her PCP.   NP L. Lendell Caprice - anesthesia updated.

## 2020-10-02 NOTE — Pre-Procedure Instructions (Signed)
Fax request to surgeon office requesting preops, to include a repeat K

## 2020-10-05 ENCOUNTER — Encounter: Payer: Self-pay | Admitting: Plastic Surgery

## 2020-10-05 NOTE — Pre-Procedure Instructions (Addendum)
Surgical Risk Level : (Low, Intermediate, High)  o Intermediate     Surgeon Testing Requirements:  o Medical clearance, labs and EKG.     Anesthesia Guideline Requirements:  o Repeat K+, [facility] DOS.     Specialist Notes / Test Results / Records Requested:  o Hemotology Clearance 08/31/2020 in [employer].   o Medical clearance with labs and EKG completed 10/01/2020 with PCP. Pt states her PCP called and told her she was cleared.  o Surgeon's office called by NAV 1 this am for preop, see PSS Note.  o PCP office called @ 1655- already closed for the day.  o H&P: need to obtain from Surgeon office.  o Consent: DOS     Recent Hospitalization / ED Visit:   o none     Future Plan / Upcoming Appts:   o none     Labs/Testing @ [facility] PSS:   o None     Bipap/Cpap usage: Encourage to use hospital OSA machine   Use Hospital OSA machine- none        Use Own OSA machine- none  Settings if known: none     Email Sent To Patient:   o Provided PSS email [email or web address] or phone [phone] to patient/family member   o [facility] Surgical Brochure emailed to [email or web address]     NPO Instructions given to patient:    o NPO instructions reviewed: Clear liquids up to 2 hours prior to arrival time, then NPO. No solid food 8 hours prior to scheduled procedure time. Examples of clear liquids include water, apple juice, sports drinks such as Gatorade, coffee or tea without milk or cream. Sugar or sweetener may be added  o Fasting Requirements per Preoperative Fasting Guidelines for Elective Surgeries and Procedures Requiring Anesthesia Policy Revised 01/2020.  Ingested material Fasting requirement   Clear liquids/Ice Chips 2 hours prior to arrival time   Breast milk 4 hours prior to scheduled procedure time   Infant formula 6 hours prior to scheduled procedure time   Non-human milk 8 hours prior to scheduled procedure time   Solid food 8 hours prior to scheduled procedure time     o Bowel prep instructions (if applicable):  none     Faxes Sent To:   o Pharmacy- DOS medication orders (if applicable) none       Epic Orders Entered:   o Preop Nursing Anesthesia Orders  o Beta Qual DOS order entered.     Other Outlying information gathered that does not fit anywhere else  o  Day of Surgery orders entered with all orders pre-selected per new [place] policy.     Chart Room Handoff for Further  Follow-up if Applicable:  o Medical clearance, labs and EKG from PCP or Surgeon.  o Anes to review.  o Need Surgeon's H&P.    Visitor Restriction Guidelines per [facility] as of 08/27/20:  All visitors must adhere to the following:    Exhibit no COVID-19 symptoms    Age 40+ (internally exceptions can be made by administrative team as needed, e.g., siblings, end of life situations, etc.)    In keeping with the CDCs current guidance, regardless of vaccination status, everyone in a healthcare facility must wear a mask covering their mouth and nose the entire time they are in the facility. Visitors who fail to wear a mask properly will be asked to leave.    The following face coverings cannot be worn at any [place] location: gaiter  style masks, bandanas or vented masks.    No visitors are allowed for patients with suspected or confirmed COVID-19, except in end-of-life situations.  Hospital Inpatient:   Visitation hours: 1:00 p.m. - 5:00 p.m. daily    Adult patients may have only one designated visitor (age 41+)   Pediatric patients may have two parents/guardians at bedside 24/7. For pediatric outpatient areas, only parents/guardians may visit.  Outpatient/Ambulatory Surgery:    Adult patients may have only one designated visitor (age 26+) to accompany the patient   No family members/visitors will be allowed into Phase 1 recovery areas. Physicians will call contact person to give report of procedure.    Family / visitor will be called by PACU staff  to review discharge instructions via phone and answer any questions.     Advise to call  surgeon if need to cancel surgery arises.      Patient verbalized understanding and acceptance of above information.

## 2020-10-05 NOTE — Pre-Procedure Instructions (Signed)
Spoke to [employer] from [employer] office. They will fax once everything is available. The BMP was done per Lupita Leash. Pt's appt was late on Friday 1/28.

## 2020-10-06 NOTE — Pre-Procedure Instructions (Signed)
Day Before Surgery Confirmation Call    Spoke UJ:WJXB [place]  Left Message:na    Confirmed surgery date, arrival time, and location.   Arrival: 0600  Surgery: 0800    NPO instructions reviewed: Clear liquids up to 2 hours prior to arrival time, then NPO. No solid food 8 hours prior to scheduled procedure time. Examples of clear liquids include water, apple juice, sports drinks such as Gatorade, coffee or tea without milk or cream. Sugar or sweetener may be added    Fasting Requirements per Preoperative Fasting Guidelines for Elective Surgeries and Procedures Requiring Anesthesia Policy Revised 01/2020  Ingested material Fasting requirement   Clear liquids/Ice Chips 2 hours prior to arrival time   Breast milk 4 hours prior to scheduled procedure time   Infant formula 6 hours prior to scheduled procedure time   Non-human milk 8 hours prior to scheduled procedure time   Solid food 8 hours prior to scheduled procedure time       Ambulatory Screening Tool:   Patient instructed that if you or your family does develop new symptoms of acute respiratory illness, to contact surgeons office immediately, prior to arrival at the hospital.     Patient denies recent visit to ED, hospitalization or PCP visit for acute illness since PSS RN interview.     Visitor Restriction Guidelines per [facility] as of 08/27/20:  All visitors must adhere to the following:    Exhibit no COVID-19 symptoms    Age 18+ (internally exceptions can be made by administrative team as needed, e.g., siblings, end of life situations, etc.)    In keeping with the CDCs current guidance, regardless of vaccination status, everyone in a healthcare facility must wear a mask covering their mouth and nose the entire time they are in the facility. Visitors who fail to wear a mask properly will be asked to leave.    The following face coverings cannot be worn at any [place] location: gaiter style masks, bandanas or vented masks.    No visitors are allowed  for patients with suspected or confirmed COVID-19, except in end-of-life situations.  Hospital Inpatient:   Visitation hours: 1:00 p.m. - 5:00 p.m. daily    Adult patients may have only one designated visitor (age 4+)   Pediatric patients may have two parents/guardians at bedside 24/7. For pediatric outpatient areas, only parents/guardians may visit.  Outpatient/Ambulatory Surgery:    Adult patients may have only one designated visitor (age 107+) to accompany the patient   No family members/visitors will be allowed into Phase 1 recovery areas. Physicians will call contact person to give report of procedure.    Family / visitor will be called by PACU staff  to review discharge instructions via phone and answer any questions.     Advise to call surgeon if need to cancel surgery arises.     Patient verbalized understanding and acceptance of above information.  If a voicemail is left for the patient and any of the COVID19 ambulatory screening questions are positive, patient is asked to call PSS ASAP.

## 2020-10-06 NOTE — Pre-Procedure Instructions (Signed)
Called and left msg at PCP for preops to be faxed. Called surgeon's office and spoke to Joseline, who will fax when she receives, as well as H&P if available.

## 2020-10-07 ENCOUNTER — Encounter: Payer: Self-pay | Admitting: Plastic Surgery

## 2020-10-07 ENCOUNTER — Ambulatory Visit: Payer: PRIVATE HEALTH INSURANCE | Admitting: Acute Care

## 2020-10-07 ENCOUNTER — Ambulatory Visit
Admission: RE | Admit: 2020-10-07 | Discharge: 2020-10-07 | Disposition: A | Discharge status: Home / Self Care | Payer: Self-pay | Source: Ambulatory Visit | Attending: Plastic Surgery | Admitting: Plastic Surgery

## 2020-10-07 ENCOUNTER — Encounter
Admission: RE | Disposition: A | Discharge status: Home / Self Care | Payer: Self-pay | Source: Ambulatory Visit | Attending: Plastic Surgery

## 2020-10-07 DIAGNOSIS — Z411 Encounter for cosmetic surgery: Secondary | ICD-10-CM | POA: Insufficient documentation

## 2020-10-07 DIAGNOSIS — M6208 Separation of muscle (nontraumatic), other site: Secondary | ICD-10-CM | POA: Insufficient documentation

## 2020-10-07 HISTORY — PX: LIPOSUCTION, FLANK (COSMETIC): SHX4745

## 2020-10-07 HISTORY — PX: LIPOSUCTION, BACK (COSMETIC): SHX4738

## 2020-10-07 HISTORY — PX: LIPOSUCTION, ABDOMEN (COSMETIC): SHX4732

## 2020-10-07 HISTORY — PX: INJECTION, FAT, (COSMETIC): SHX9537

## 2020-10-07 HISTORY — PX: ABDOMINOPLASTY, (COSMETIC): SHX3000

## 2020-10-07 LAB — CBC
Absolute NRBC: 0 10*3/uL (ref 0.00–0.00)
Hematocrit: 30.6 % — ABNORMAL LOW (ref 34.7–43.7)
Hgb: 9.4 g/dL — ABNORMAL LOW (ref 11.4–14.8)
MCH: 23.3 pg — ABNORMAL LOW (ref 25.1–33.5)
MCHC: 30.7 g/dL — ABNORMAL LOW (ref 31.5–35.8)
MCV: 75.7 fL — ABNORMAL LOW (ref 78.0–96.0)
Nucleated RBC: 0 /100 WBC (ref 0.0–0.0)
Platelets: 222 10*3/uL (ref 142–346)
RBC: 4.04 10*6/uL (ref 3.90–5.10)
RDW: 16 % — ABNORMAL HIGH (ref 11–15)
WBC: 13.02 10*3/uL — ABNORMAL HIGH (ref 3.10–9.50)

## 2020-10-07 SURGERY — ABDOMINOPLASTY, (COSMETIC)
Anesthesia: Anesthesia General | Site: Flank | Wound class: Clean

## 2020-10-07 MED ORDER — BACITRACIN ZINC 500 UNIT/GM EX OINT
TOPICAL_OINTMENT | CUTANEOUS | Status: AC
Start: 2020-10-07 — End: ?
  Filled 2020-10-07: qty 28.35

## 2020-10-07 MED ORDER — DEXMEDETOMIDINE HCL IN NACL 200 MCG/50ML IV SOLN
INTRAVENOUS | Status: AC
Start: 2020-10-07 — End: ?
  Filled 2020-10-07: qty 50

## 2020-10-07 MED ORDER — PROMETHAZINE HCL 25 MG/ML IJ SOLN
6.2500 mg | Freq: Once | INTRAMUSCULAR | Status: DC | PRN
Start: 2020-10-07 — End: 2020-10-08

## 2020-10-07 MED ORDER — SUCCINYLCHOLINE CHLORIDE 20 MG/ML IJ SOLN
INTRAMUSCULAR | Status: DC | PRN
Start: 2020-10-07 — End: 2020-10-07
  Administered 2020-10-07: 180 mg via INTRAVENOUS

## 2020-10-07 MED ORDER — DEXAMETHASONE SODIUM PHOSPHATE 4 MG/ML IJ SOLN
INTRAMUSCULAR | Status: AC
Start: 2020-10-07 — End: ?
  Filled 2020-10-07: qty 1

## 2020-10-07 MED ORDER — LIDOCAINE HCL 2 % IJ SOLN
INTRAMUSCULAR | Status: AC
Start: 2020-10-07 — End: ?
  Filled 2020-10-07: qty 20

## 2020-10-07 MED ORDER — CEFAZOLIN SODIUM 1 G IJ SOLR
INTRAMUSCULAR | Status: AC
Start: 2020-10-07 — End: ?
  Filled 2020-10-07: qty 2000

## 2020-10-07 MED ORDER — ROCURONIUM BROMIDE 50 MG/5ML IV SOLN
INTRAVENOUS | Status: AC
Start: 2020-10-07 — End: ?
  Filled 2020-10-07: qty 5

## 2020-10-07 MED ORDER — HYDROMORPHONE HCL 1 MG/ML IJ SOLN
INTRAMUSCULAR | Status: AC
Start: 2020-10-07 — End: ?
  Filled 2020-10-07: qty 1

## 2020-10-07 MED ORDER — TRANEXAMIC ACID 1000 MG/10ML IV SOLN
INTRAVENOUS | Status: AC
Start: 2020-10-07 — End: ?
  Filled 2020-10-07: qty 10

## 2020-10-07 MED ORDER — DEXTROSE 5 % IV SOLN
2.0000 g | Freq: Once | INTRAVENOUS | Status: AC
Start: 2020-10-07 — End: 2020-10-07
  Administered 2020-10-07 (×2): 2 g via INTRAVENOUS
  Filled 2020-10-07: qty 2000

## 2020-10-07 MED ORDER — NEOSTIGMINE METHYLSULFATE 1 MG/ML IJ/IV SOLN (WRAP)
Status: AC
Start: 2020-10-07 — End: ?
  Filled 2020-10-07: qty 5

## 2020-10-07 MED ORDER — ENOXAPARIN SODIUM 40 MG/0.4ML SC SOLN
SUBCUTANEOUS | Status: AC
Start: 2020-10-07 — End: ?
  Filled 2020-10-07: qty 0.4

## 2020-10-07 MED ORDER — ONDANSETRON HCL 4 MG/2ML IJ SOLN
INTRAMUSCULAR | Status: DC | PRN
Start: 2020-10-07 — End: 2020-10-07
  Administered 2020-10-07: 4 mg via INTRAVENOUS

## 2020-10-07 MED ORDER — GLYCOPYRROLATE 1 MG/5ML IJ SOLN
INTRAMUSCULAR | Status: AC
Start: 2020-10-07 — End: ?
  Filled 2020-10-07: qty 5

## 2020-10-07 MED ORDER — LIDOCAINE HCL (PF) 1 % IJ SOLN
INTRAMUSCULAR | Status: AC
Start: 2020-10-07 — End: ?
  Filled 2020-10-07: qty 180

## 2020-10-07 MED ORDER — PROPOFOL INFUSION 10 MG/ML
INTRAVENOUS | Status: DC | PRN
Start: 2020-10-07 — End: 2020-10-07
  Administered 2020-10-07: 50 ug/kg/min via INTRAVENOUS

## 2020-10-07 MED ORDER — EPINEPHRINE HCL 1 MG/ML IJ SOLN (WRAP)
Status: AC
Start: 2020-10-07 — End: ?
  Filled 2020-10-07: qty 2

## 2020-10-07 MED ORDER — LIDOCAINE HCL (PF) 1 % IJ SOLN
INTRAMUSCULAR | Status: AC
Start: 2020-10-07 — End: ?
  Filled 2020-10-07: qty 120

## 2020-10-07 MED ORDER — ROCURONIUM BROMIDE 10 MG/ML IV SOLN (WRAP)
INTRAVENOUS | Status: DC | PRN
Start: 2020-10-07 — End: 2020-10-07
  Administered 2020-10-07: 10 mg via INTRAVENOUS
  Administered 2020-10-07: 50 mg via INTRAVENOUS
  Administered 2020-10-07: 40 mg via INTRAVENOUS

## 2020-10-07 MED ORDER — MIDAZOLAM HCL 1 MG/ML IJ SOLN (WRAP)
INTRAMUSCULAR | Status: AC
Start: 2020-10-07 — End: ?
  Filled 2020-10-07: qty 2

## 2020-10-07 MED ORDER — ENOXAPARIN SODIUM 40 MG/0.4ML SC SOLN
40.0000 mg | Freq: Once | SUBCUTANEOUS | Status: AC
Start: 2020-10-07 — End: 2020-10-07
  Administered 2020-10-07: 08:00:00 40 mg via SUBCUTANEOUS

## 2020-10-07 MED ORDER — MIDAZOLAM HCL 1 MG/ML IJ SOLN (WRAP)
INTRAMUSCULAR | Status: DC | PRN
Start: 2020-10-07 — End: 2020-10-07
  Administered 2020-10-07 (×2): 2 mg via INTRAVENOUS

## 2020-10-07 MED ORDER — EPINEPHRINE HCL 1 MG/ML IJ SOLN (WRAP)
Status: AC
Start: 2020-10-07 — End: ?
  Filled 2020-10-07: qty 1

## 2020-10-07 MED ORDER — PROPOFOL 10 MG/ML IV EMUL (WRAP)
INTRAVENOUS | Status: DC | PRN
Start: 2020-10-07 — End: 2020-10-07
  Administered 2020-10-07: 300 mg via INTRAVENOUS
  Administered 2020-10-07: 50 mg via INTRAVENOUS

## 2020-10-07 MED ORDER — ONDANSETRON HCL 4 MG/2ML IJ SOLN
4.0000 mg | Freq: Once | INTRAMUSCULAR | Status: DC | PRN
Start: 2020-10-07 — End: 2020-10-08

## 2020-10-07 MED ORDER — OXYCODONE HCL 5 MG PO TABS
5.0000 mg | ORAL_TABLET | Freq: Once | ORAL | Status: DC | PRN
Start: 2020-10-07 — End: 2020-10-08

## 2020-10-07 MED ORDER — SUCCINYLCHOLINE CHLORIDE 20 MG/ML IJ SOLN
INTRAMUSCULAR | Status: AC
Start: 2020-10-07 — End: ?
  Filled 2020-10-07: qty 10

## 2020-10-07 MED ORDER — LIDOCAINE-EPINEPHRINE 1 %-1:100000 IJ SOLN
INTRAMUSCULAR | Status: AC
Start: 2020-10-07 — End: ?
  Filled 2020-10-07: qty 20

## 2020-10-07 MED ORDER — FENTANYL CITRATE (PF) 50 MCG/ML IJ SOLN (WRAP)
INTRAMUSCULAR | Status: DC | PRN
Start: 2020-10-07 — End: 2020-10-07
  Administered 2020-10-07: 50 ug via INTRAVENOUS
  Administered 2020-10-07: 100 ug via INTRAVENOUS
  Administered 2020-10-07 (×2): 50 ug via INTRAVENOUS

## 2020-10-07 MED ORDER — PROPOFOL 10 MG/ML IV EMUL (WRAP)
INTRAVENOUS | Status: AC
Start: 2020-10-07 — End: ?
  Filled 2020-10-07: qty 40

## 2020-10-07 MED ORDER — ONDANSETRON HCL 4 MG/2ML IJ SOLN
INTRAMUSCULAR | Status: AC
Start: 2020-10-07 — End: ?
  Filled 2020-10-07: qty 2

## 2020-10-07 MED ORDER — LIDOCAINE-EPINEPHRINE 1 %-1:100000 IJ SOLN
INTRAMUSCULAR | Status: DC | PRN
Start: 2020-10-07 — End: 2020-10-07
  Administered 2020-10-07: 30 mL

## 2020-10-07 MED ORDER — HYDROMORPHONE HCL 0.5 MG/0.5 ML IJ SOLN
0.5000 mg | INTRAMUSCULAR | Status: DC | PRN
Start: 2020-10-07 — End: 2020-10-08

## 2020-10-07 MED ORDER — HYDROMORPHONE HCL 1 MG/ML IJ SOLN
INTRAMUSCULAR | Status: DC | PRN
Start: 2020-10-07 — End: 2020-10-07
  Administered 2020-10-07 (×2): .4 mg via INTRAVENOUS
  Administered 2020-10-07: .6 mg via INTRAVENOUS
  Administered 2020-10-07: .2 mg via INTRAVENOUS
  Administered 2020-10-07: .4 mg via INTRAVENOUS

## 2020-10-07 MED ORDER — BUPIVACAINE-EPINEPHRINE (PF) 0.25% -1:200000 IJ SOLN
INTRAMUSCULAR | Status: DC | PRN
Start: 2020-10-07 — End: 2020-10-07
  Administered 2020-10-07: 15 mL via INTRAMUSCULAR

## 2020-10-07 MED ORDER — DEXAMETHASONE SODIUM PHOSPHATE 4 MG/ML IJ SOLN (WRAP)
INTRAMUSCULAR | Status: DC | PRN
Start: 2020-10-07 — End: 2020-10-07
  Administered 2020-10-07 (×2): 4 mg via INTRAVENOUS

## 2020-10-07 MED ORDER — ACETAMINOPHEN 325 MG PO TABS
975.0000 mg | ORAL_TABLET | Freq: Once | ORAL | Status: DC | PRN
Start: 2020-10-07 — End: 2020-10-08

## 2020-10-07 MED ORDER — ALBUMIN HUMAN/BIOSIMILIAR 5% IV SOLN (WRAP)
INTRAVENOUS | Status: DC | PRN
Start: 2020-10-07 — End: 2020-10-07

## 2020-10-07 MED ORDER — MEPERIDINE HCL 25 MG/ML IJ SOLN
12.5000 mg | Freq: Once | INTRAMUSCULAR | Status: DC
Start: 2020-10-07 — End: 2020-10-08

## 2020-10-07 MED ORDER — NEOSTIGMINE METHYLSULFATE 1 MG/ML IJ/IV SOLN (WRAP)
Status: DC | PRN
Start: 2020-10-07 — End: 2020-10-07
  Administered 2020-10-07: 5 mg via INTRAVENOUS

## 2020-10-07 MED ORDER — BUPIVACAINE-EPINEPHRINE (PF) 0.25% -1:200000 IJ SOLN
INTRAMUSCULAR | Status: AC
Start: 2020-10-07 — End: ?
  Filled 2020-10-07: qty 30

## 2020-10-07 MED ORDER — TRANEXAMIC ACID 1000 MG/10ML IV SOLN
INTRAVENOUS | Status: DC | PRN
Start: 2020-10-07 — End: 2020-10-07
  Administered 2020-10-07: 09:00:00 800 mL via SUBCUTANEOUS
  Administered 2020-10-07: 12:00:00 600 mL via SUBCUTANEOUS

## 2020-10-07 MED ORDER — OXYCODONE-ACETAMINOPHEN 5-325 MG PO TABS
1.0000 | ORAL_TABLET | Freq: Once | ORAL | Status: DC | PRN
Start: 2020-10-07 — End: 2020-10-08
  Administered 2020-10-07: 1 via ORAL
  Filled 2020-10-07: qty 1

## 2020-10-07 MED ORDER — LIDOCAINE HCL 2 % IJ SOLN
INTRAMUSCULAR | Status: DC | PRN
Start: 2020-10-07 — End: 2020-10-07
  Administered 2020-10-07: 100 mg

## 2020-10-07 MED ORDER — TRANEXAMIC ACID 2000MG/100 ML ADULT BOLUS
Status: DC | PRN
Start: 2020-10-07 — End: 2020-10-07

## 2020-10-07 MED ORDER — LIDOCAINE HCL 1 % IJ SOLN
INTRAVENOUS | Status: DC | PRN
Start: 2020-10-07 — End: 2020-10-07
  Administered 2020-10-07: 09:00:00 1000 mL via SUBCUTANEOUS

## 2020-10-07 MED ORDER — GLYCOPYRROLATE 0.2 MG/ML IJ SOLN (WRAP)
INTRAMUSCULAR | Status: DC | PRN
Start: 2020-10-07 — End: 2020-10-07
  Administered 2020-10-07: .6 mg via INTRAVENOUS

## 2020-10-07 MED ORDER — FENTANYL CITRATE (PF) 50 MCG/ML IJ SOLN (WRAP)
25.0000 ug | INTRAMUSCULAR | Status: DC | PRN
Start: 2020-10-07 — End: 2020-10-08
  Administered 2020-10-07: 25 ug via INTRAVENOUS
  Filled 2020-10-07: qty 2

## 2020-10-07 MED ORDER — ALBUMIN HUMAN/BIOSIMILIAR 5% IV SOLN (WRAP)
INTRAVENOUS | Status: AC
Start: 2020-10-07 — End: ?
  Filled 2020-10-07: qty 1000

## 2020-10-07 MED ORDER — FENTANYL CITRATE (PF) 50 MCG/ML IJ SOLN (WRAP)
INTRAMUSCULAR | Status: AC
Start: 2020-10-07 — End: ?
  Filled 2020-10-07: qty 5

## 2020-10-07 MED ORDER — LACTATED RINGERS IV SOLN
INTRAVENOUS | Status: DC
Start: 2020-10-07 — End: 2020-10-08

## 2020-10-07 MED ORDER — AMMONIA AROMATIC IN INHA
1.0000 | Freq: Once | RESPIRATORY_TRACT | Status: DC | PRN
Start: 2020-10-07 — End: 2020-10-08

## 2020-10-07 MED ORDER — SODIUM CHLORIDE 0.9% BAG (IRRIGATION USE)
INTRAVENOUS | Status: DC | PRN
Start: 2020-10-07 — End: 2020-10-07
  Administered 2020-10-07: 1000 mL

## 2020-10-07 SURGICAL SUPPLY — 173 items
ADHESIVE DERMABOND PRINEO 22CM (Skin Closure) ×6
ADHESIVE LIQUID WATERPROOF VIAL PREP NONSTAIN MASTISOL STYRAX GUM (Skin Closure) ×4 IMPLANT
ADHESIVE LQ STYRAX GUM MASTIC ALC MTHY (Skin Closure)
ADHESIVE SKIN CLOSURE DERMABOND PRINEO (Skin Closure) ×24
ADHESIVE SKIN CLOSURE DERMABOND PRINEO LIQUID 2-OCTYL CYANOACRYLATE (Skin Closure) ×8 IMPLANT
APPLIER INTERNAL CLIP L9.75 IN AUTOMATIC (Procedure Accessories) ×8
APPLIER INTERNAL CLIP L9.75 IN AUTOMATIC PREMIUM SURGICLIP II SUPER (Procedure Accessories) IMPLANT
BANDAGE STERI-STRIP 0.5X4IN (Dressing)
BINDER ABDOM 12 WIDE 75X84 XL (Patient Supply)
BINDER ABDOMINAL H12 IN SMALL MEDIUM (Patient Supply)
BINDER ABDOMINAL H12 IN SMALL MEDIUM STANDARD 4 PANEL ELASTIC FLEXIBLE (Patient Supply) IMPLANT
BINDER ABDOMINAL W12 IN LARGE MULTIPANEL (Patient Supply)
BINDER ABDOMINAL W12 IN LARGE MULTIPANEL HOOK LOOP CLOSURE OD63-74 IN (Patient Supply) IMPLANT
BINDER ABDOMINAL W12 IN XL BARIATRIC (Patient Supply)
BINDER ABDOMINAL W12 IN XL BARIATRIC MULTIPANEL HOOK LOOP CLOSURE (Patient Supply) IMPLANT
BINDER ABDON 12 WIDE 60X74 L (Patient Supply)
BINDR ABD 12IN 4PNL STD SM MED (Patient Supply)
BLADE 10 BARD-PARKER RIB-BACK CARBON (Blade) ×32
BLADE 10 BARD-PARKER RIB-BACK CARBON STEEL TISSUE SURGICAL (Blade) ×24 IMPLANT
BLADE SURG 10 (Blade) ×8
BO SWAT PROJ CURAD DRSG GAUZE MESH 9X5IN (Procedure Accessories) ×1
BULB DRAINAGE LIGHTWEIGHT LOW LEVEL (Drain) ×16
BULB DRAINAGE LIGHTWEIGHT LOW LEVEL SUCTION RELIAVAC SILICONE 100 CC (Drain) ×8 IMPLANT
CANISTER SCT 3L 60ML LIPOFILTER STRL 2 (Other) ×4
CANISTER SUC 3L 60ML TBG 2CH (Other) ×1
CANISTER SUCTION 3 L 60 ML 2 CHAMBER ASPIRATION FAT HARVEST SYRINGE (Other) IMPLANT
CANNULA DBL MERC 5X10MMX30CM (Cannula) ×1
CANNULA LIPOSUCTION L30 CM BENT FLARE (Cannula)
CANNULA LIPOSUCTION L30 CM BENT FLARE OD5 MM MERCEDES L10 MM (Cannula) IMPLANT
CANNULA MERCEDES 4X10MMX22CM (Cannula)
CANNULA MICROAIRE 5.0 (Cannula)
CANNULA SUCTION L22 CM MERCEDES STYLE (Cannula)
CANNULA SUCTION L22 CM MERCEDES STYLE PORT OD4 MM MERCEDES L10 MM (Cannula) IMPLANT
CANNULA SUCTION L30 CM SLOT OD5 MM L10 (Cannula) ×4
CANNULA SUCTION L30 CM SLOT OD5 MM PAL L10 MM POWER ASSISTED (Cannula) IMPLANT
CANNULA SUCTION L30 CM TRIPORT II TIP (Cannula) ×8
CANNULA SUCTION L30 CM TRIPORT II TIP OD4 MM PAL L10 MM POWER ASSISTED (Cannula) IMPLANT
CANNULA TRIPORT II 4X10MMX30CM (Cannula) ×2
COUNTER NDL 20 CNT 2 MAGNET DEVON STRL LF 1200 DSPSBL BLCK PLSTC FOAM (Procedure Accessories) IMPLANT
COUNTER NDL PLS FM DVN LF STRL 20 CNT 2 (Procedure Accessories) ×4
COUNTER NEEDLE 20 COUNT (Procedure Accessories) ×1
COVER CAM LF STRL LEN DISP CLR (Procedure Accessories) ×4
COVER CAMERA DISP STERILE (Procedure Accessories) ×1
COVER CAMERA LENS DISPOSABLE CLEAR STERILE LATEX FREE (Procedure Accessories) IMPLANT
DEVICE CLOSURE V-LOC 180 GS-21 (Suture) ×2
DRAIN BLAKE RND 15F SILI HBLSS (Drain) ×4
DRAIN OD15 FR 4 CHANNEL RADIOPAQUE (Drain) ×16
DRAIN OD15 FR 4 CHANNEL RADIOPAQUE HUBLESS SOLID CORE CENTER BLAKE (Drain) IMPLANT
DRAN EVACUATOR WOUND 100CC (Drain) ×4
DRESSING CURAD GAUZE MESH 9X5IN (Procedure Accessories) ×4
DRESSING PETROLATUM L9 IN X W5 IN NON ADHERENT OCCLUSIVE CURAD BISMUTH (Procedure Accessories) IMPLANT
DRESSING PETROLATUM XEROFORM L8 IN X W1 (Dressing) ×4
DRESSING PETROLATUM XEROFORM L8 IN X W1 IN 3% BISMUTH TRIBROMOPHENATE (Dressing) IMPLANT
DRESSING TEGADERM 4X4X3/4IN (Dressing) ×6
DRESSING TEGADERM 8X12IN (Dressing) ×1
DRESSING TRANSPARENT L12 IN X W8 IN (Dressing) ×4
DRESSING TRANSPARENT L12 IN X W8 IN POLYURETHANE ADHESIVE (Dressing) IMPLANT
DRESSING TRANSPARENT L4 3/4 IN X W4 IN (Dressing) ×24
DRESSING TRANSPARENT L4 3/4 IN X W4 IN POLYURETHANE ADHESIVE (Dressing) IMPLANT
DRESSING XEROFORM 1X8 (Dressing) ×1
GLOVE BIOGEL SURG SENSE SZ 6.5 (Glove) ×2
GLOVE SURGICAL 6 1/2 BIOGEL SENSOR (Glove) ×8
GLOVE SURGICAL 6 1/2 BIOGEL SENSOR POWDER FREE TEXTURE BEAD CUFF STRAW (Glove) ×4 IMPLANT
GLOVE SURGICAL 7 BIOGEL PI INDICATOR (Glove) ×8
GLOVE SURGICAL 7 BIOGEL PI INDICATOR UNDERGLOVE POWDER FREE SMOOTH (Glove) ×4 IMPLANT
GOWN POLY-R BREATH IMPERV (Gown) ×2
GOWN PREVNTN+ LARGE STRL (Gown) ×1
GOWN SURGICAL L BLUE AAMI LEVEL 4 BREATHABLE CSR WRAP PROTECTION (Gown) ×4 IMPLANT
GOWN SURGICAL L MEDLINE BLUE AAMI LEVEL (Gown) ×4
GOWN SURGICAL XL L47 IN SMS POLYETHYLENE (Gown) ×8
GOWN SURGICAL XL L47 IN SMS POLYETHYLENE BLUE LEVEL 4 REINFORCE HOOK (Gown) IMPLANT
MARKER SKIN L6 IN RULER LARGE RESERVOIR (Prep) ×12
MARKER SKIN L6 IN RULER LG RESERVOIR RGLR TIP MEDLN GENTIAN VLT (Prep) IMPLANT
MASTISOL VIAL 2/3CC STRL (Skin Closure)
MRKR SKN W RULER AND LABELS (Positioning Supplies) ×6
MRKR SKN W RULER STRL (Prep) ×3
NEEDLE 18GA 1-1/2IN (Needles) ×1
NEEDLE 25GA 1 1/2 (Needles)
NEEDLE HPO PP RW BD PRCSNGL 18GA 1.5IN (Needles) ×4 IMPLANT
NEEDLE HPO SS PP RW BD 25GA 1.5IN LF (Needles) ×4 IMPLANT
PAD ABD PVC CRTY 9X5IN LF STRL 3 LYR (Dressing) ×28 IMPLANT
PAD ARMBOARD FOAM 20X8X2IN (Positioning Supplies) ×2
PAD ARMBOARD L20 IN X W8 IN X H2 IN (Positioning Supplies) ×8
PAD ARMBOARD L20 IN X W8 IN X H2 IN CONVOLUTE FOAM PURPLE (Positioning Supplies) ×4 IMPLANT
PAD TENDRSRB ABD STRL 5X9IN (Dressing) ×1
PEN SURGICAL MARKING MEDLINE SKIN RULER (Positioning Supplies) ×24
PEN SURGICAL MARKING SKIN RULER LARGE RESERVOIR REGULAR TIP LABEL (Positioning Supplies) ×20 IMPLANT
PENCIL SMOKE EVAC 70MM PSH BTN (Cautery) ×2
PENCIL SMOKE EVACUATOR COATED PUSH (Cautery) ×8
PENCIL SMOKE EVACUATOR COATED PUSH BUTTON NEPTUNE E-SEP (Cautery) IMPLANT
PIN SAFETY 2IN (Procedure Accessories) ×5
PIN SAFETY L2 IN LARGE GROUPING HOLDING RETAINING (Procedure Accessories) IMPLANT
POSITIONER HEAD FOAM 7X2", LF (Positioning Supplies) ×1
POSITIONER OR HIGH RESILIENT CUSHION (Positioning Supplies) ×4
POSITIONER OR HIGH RESILIENT CUSHION MULTIRING FOAM HEAD RASPBERRY (Positioning Supplies) IMPLANT
SOL NACL.9% 1000ML IRR NONLTX (Irrigation Solutions) ×4
SOLUTION IRRIGATION 0.9% SODIUM CHLORIDE (Irrigation Solutions) ×16
SOLUTION IRRIGATION 0.9% SODIUM CHLORIDE 1000 ML PLASTIC POUR BOTTLE (Irrigation Solutions) ×4 IMPLANT
SPONGE DRSG RYN PLSTR KDL EXCLN 4X4IN LF (Sponge) IMPLANT
SPONGE EXCILON DRN 6PLY 4X4IN (Sponge) ×1
SPONGE GAUZE L4 IN X W4 IN 12 PLY (Sponge) IMPLANT
SPONGE GAUZE STR 10'S 12PLY4X4 (Sponge) ×2
SPONGE LAPAROTOMY 18X18IN (Sponge) ×7
SPONGE LAPAROTOMY L18 IN X W18 IN 4 PLY (Sponge) ×28
SPONGE LAPAROTOMY L18 IN X W18 IN 4 PLY RADIOPAQUE PYRONEMA FREE HIGH (Sponge) IMPLANT
STAPLER APPOSE ULC 35 W SKIN (Staplers) ×2
STAPLER SKIN L4.1 MM X W6.5 MM 35 WIDE (Staplers) ×8
STAPLER SKIN L4.1 MM X W6.5 MM 35 WIDE STAPLE CARTRIDGE APPOSE ULC (Staplers) ×24 IMPLANT
STRIP SKIN CLOSURE L4 IN X W1/2 IN (Dressing)
STRIP SKIN CLOSURE L4 IN X W1/2 IN REINFORCE STERI-STRIP POLYESTER (Dressing) ×4 IMPLANT
SURGICLIP PREMIUM II M-9.75 (Procedure Accessories) ×2
SUT ETHILON 2-0 FS 18IN (Suture) ×2
SUTURE 3-0 FS-1 24MM (Suture) ×4
SUTURE ABS MONODERM 2-0 .5 CRC QUILL 16 (Suture) ×24
SUTURE COATED VICRYL 2-0 SH L27 IN BRAID (Suture) ×16
SUTURE COATED VICRYL 2-0 SH L27 IN BRAID COATED UNDYED ABSORBABLE (Suture) IMPLANT
SUTURE ETHIBOND 3-0 SH 30IN (Suture) ×2
SUTURE ETHIBOND EXCEL GREEN 3-0 SH L30 (Suture) ×8
SUTURE ETHIBOND EXCEL GREEN 3-0 SH L30 IN BRAID NONABSORBABLE (Suture) IMPLANT
SUTURE ETHILON 5-0 FS2 18IN (Suture)
SUTURE ETHILON BLACK 2-0 FS L18 IN (Suture) ×8
SUTURE ETHILON BLACK 3-0 FS-1 L18 IN (Suture) ×8 IMPLANT
SUTURE ETHILON BLACK 5-0 FS-2 L18 IN (Suture)
SUTURE ETHILON BLACK 5-0 FS-2 L18 IN MONOFILAMENT NONABSORBABLE (Suture) ×4 IMPLANT
SUTURE MONOCRYL 3-0 PS-2 18IN (Suture) ×1
SUTURE MONOCRYL 3-0 PS-2 L18 IN (Suture) ×4
SUTURE MONOCRYL 3-0 PS-2 L18 IN MONOFILAMENT UNDYED ABSORBABLE (Suture) ×24 IMPLANT
SUTURE MONOCRYL 4-0 PS-2 L18 IN (Suture)
SUTURE MONOCRYL 4-0 PS-2 L18 IN MONOFILAMENT UNDYED ABSORBABLE (Suture) ×4 IMPLANT
SUTURE MONOCRYL 4-0 PS2 18 IN (Suture)
SUTURE MONODERM QUILL 2-0 1/2 CIRCLE L16 CM X W16 CM MONOFILAMENT (Suture) ×4 IMPLANT
SUTURE NYLON ETHILON BLACK SILVER 2-0 L18 IN L26 MM REVERSE CUTTING FS (Suture) IMPLANT
SUTURE PDS II 0 CT-1 36IN (Suture) ×1
SUTURE PDS II 0 CT-1 L36 IN MONOFILAMENT (Suture) ×4
SUTURE PDS II 0 CT-1 L36 IN MONOFILAMENT VIOLET ABSORBABLE (Suture) IMPLANT
SUTURE PDS II 2-0 CT-1 L27 IN (Suture) ×108
SUTURE PDS II 2-0 CT-1 L27 IN MONOFILAMENT VIOLET ABSORBABLE (Suture) IMPLANT
SUTURE PDS II 2-0 CT1 27IN (Suture) ×27
SUTURE PLAIN FAST ABSORBING GUT 5-0 PC-1 (Suture) ×4
SUTURE PLAIN FAST ABSORBING GUT 5-0 PC-1 L18 IN MNFLMNT YELLOWISH TAN (Suture) IMPLANT
SUTURE PLN GUT 5.0 (Suture) ×1
SUTURE POLY 3-0 SP MONO+ PS-2 60CM (Suture) ×4
SUTURE QUILL 2-0 MONO 16X16CM (Suture) ×6
SUTURE STRATAFIX SPIRAL MONOCRYL PLUS (Suture) ×16
SUTURE STRATAFIX SPIRAL MONOCRYL PLUS 3-0 PS-2 SPIRAL L60 CM KNOTLESS (Suture) IMPLANT
SUTURE V-LOC 180 0 GS-21 1/2 CIRCLE L18 IN ABS GREEN (Suture) IMPLANT
SUTURE V-LOC 180 0 GS-21 L45 CM (Suture) ×8
SUTURE VICRYL 2-0 SH 27IN (Suture) ×4
SYRINGE 10 ML GRADUATE NONPYROGENIC DEHP (Syringes, Needles)
SYRINGE 10 ML GRADUATE NONPYROGENIC DEHP FREE PVC FREE LOK MEDICAL (Syringes, Needles) ×4 IMPLANT
SYRINGE LUER LOCK 10CC (Syringes, Needles)
TOWEL L27 IN X W17 IN COTTON PREWASH (Other) ×4
TOWEL L27 IN X W17 IN COTTON PREWASH DELINT HIGH ABSORBENT BLUE (Other) IMPLANT
TOWEL OR DISP 10PK (Other) ×1
TRAY DRY SKIN SCRUB (Prep) ×2
TRAY MAJOR PLASTIC FOAKS (Pack) ×1
TRAY SKIN SCRUB L8 IN 6 WING 6 SPONGE STICK 2 TIP APPLICATOR DRY VINYL (Prep) ×8 IMPLANT
TRAY SKIN SCRUB MEDLINE L8 IN VINYL (Prep) ×8
TRAY SRG MAJOR PLASTIC IFOH (Pack) ×4
TRAY SURGICAL MAJOR PLASTIC FOAKS (Pack) IMPLANT
TUBING INFILTRATION L13 FT UNIVERSAL Y (Tubing) ×4
TUBING INFILTRATION L13 FT UNIVERSAL Y CONTOUR GENESIS (Tubing) ×4 IMPLANT
TUBING INFILTRATION LIPOTOWER 1 SPIKE (Tubing) ×4 IMPLANT
TUBING INFILTRTN GENESIS 13'X7 (Tubing) ×1
TUBING INFLTR LIPOTOWER 1 SPK (Tubing) ×4
TUBING PAL ASPIRATION 12FT (Tubing) ×1
TUBING SGL SPIKE INFIL 15.5FT (Tubing) ×1
TUBING SMOKE EVAC 3/8IN (Tubing) ×2
TUBING SMOKE EVACUATOR OD3/8 IN (Tubing) ×8
TUBING SMOKE EVACUATOR OD3/8 IN NA (Tubing) IMPLANT
TUBING SUCTION L12 FT SET (Tubing) ×4
TUBING SUCTION L12 FT SET PAL (Tubing) ×4 IMPLANT
UNDRGLOV SZ 7 PI INDICATR BLUE (Glove) ×2

## 2020-10-07 NOTE — Interval H&P Note (Signed)
Pt seen, re-examined, H&P reviewed.  No changes.   Denies smoking

## 2020-10-07 NOTE — Discharge Instr - AVS First Page (Addendum)
Reason for your Hospital Admission:  ***      Instructions for after your discharge:  *** Please follow Dr. Mortimer Fries discharge instructions provided to you prior to surgery.   Empty JP drains every 12 hours and record on paper.   Take medications as prescribed. Pain medication given in recovery room at 6PM.Take pain medication at 10PM or 12AM if needed.      Post Anesthesia Discharge Instructions    Although you may be awake and alert in the recovery room, small amounts of anesthetic remain in your system for about 24 hours.  You may feel tired and sleepy during this time.      You are advised to go directly home from the hospital.    Plan to stay at home and rest for the remainder of the day.    It is advisable to have someone with you at home for 24 hours after surgery.    Do not operate a motor vehicle, or any mechanical or electrical equipment for the next 24 hours.      Be careful when you are walking around, you may become dizzy.  The effects of anesthesia and/or medications are still present and drowsiness may occur    Do not consume alcohol, tranquilizers, sleeping medications, or any other non prescribed medication for the remainder of the day.    Diet:  begin with liquids, progress your diet as tolerated or as directed by your surgeon.  Nausea and vomiting may occur in the next 24 hours.        Discharge Instructions: Caring for Your Jackson-Pratt Drainage Tube  Your healthcare provider discharged you with a Jackson-Pratt drainage tube. They commonly leave this drain within the abdomen and other cavities after surgery. It helps drain and collect blood and body fluid after surgery. This can prevent swelling and reduces the risk for infection. The tube is held in place by a few stitches. It's covered with a bandage. Your healthcare provider will remove the drain when he or she determines you no longer need it.  Home care  Dont sleep on the same side as the tube.  Secure the tube and bag inside your clothing  with a safety pin. This helps keep the tube from being pulled out.  Empty your drain at least twice a day. Empty it more often if the drain is full. Wash  and dry your hands before emptying the drain.  Lift the opening on the drain.  Drain the fluid into a measuring cup.  Record the amount of fluid each time you empty the drain. Include the date and time it was emptied. Share this information with your healthcare provider on your next visit.  Squeeze the bulb with your hands until you hear air coming out of the bulb if your healthcare provider has instructed you to do so (sometimes the bulb is used as a reservoir without suction). Check with your healthcare provider about specific drain instructions.  Close the opening.  If you are to change the dressing around the tube, follow the directions your provider has given you. Some dressings may not need to be changed, but your provider will let you know. Here are some general steps to follow:  Wash your hands.  Remove the old bandage.  Wash your hands again.  Clean the skin around the incision and tube site as instructed.  Put a new bandage on the incision and tube site. Make the bandage large enough to cover the whole incision  area.  Tape the bandage in place.  Talk with your healthcare provider about showering with the drain. You may need to keep the bandage and tube site dry when you shower. Ask your healthcare team about the best way to do this.  Stripping the tube helps keep blood clots from blocking the tube. Ask your healthcare team how often you should strip the tube. Stripping may not be needed, depending on where and why your healthcare provider placed the tube. It may even be dangerous in some cases.   Hold the tubing where it leaves the skin, with one hand. This keeps it from pulling on the skin.  Pinch the tubing with the thumb and first finger of your other hand.  Slowly and firmly pull your thumb and first finger down the tubing. You may find it helpful  to hold an alcohol swab between your fingers and the tube to lubricate the tubing.  If the pulling hurts or feels like the tube is coming out of the skin, stop. Begin again more gently.    Follow-up care  Make a follow-up appointment as directed by our staff.  When to call your healthcare provider  Call your healthcare provider right away if you have any of the following:  New or increased pain around the tube  Redness, swelling, or warmth around the incision or tube  Drainage that is foul-smelling  Vomiting  Fever of 100.47F ( 38C) or higher, or as directed by your provider  Fluid leaking around the tube  Incision seems not to be healing  Stitches become loose  Tube falls out or breaks  Drainage that changes from light pink to dark red  Blood clots in the drainage bulb  A sudden increase or decrease in the amount of drainage (over 30 mL)  [employer] last reviewed this educational content on 06/05/2018     2000-2021 [employer], [place]. All rights reserved. This information is not intended as a substitute for professional medical care. Always follow your healthcare professional's instructions.

## 2020-10-07 NOTE — PACU (Signed)
Pt refuses cath and wishes to try urinating one more time.

## 2020-10-07 NOTE — Brief Op Note (Signed)
BRIEF OP NOTE    Date Time: 10/07/20 5:13 PM    Patient Name:   Caitlin Morse    Date of Operation:   10/07/2020    Providers Performing:   Surgeon(s):  Ardeen Fillers, MD    Assistant (s):   Circulator: Meade Maw, RN  Relief Circulator: Rainey Pines, RN; Raeanne Barry, RN  Relief Scrub: Ulysees Barns D  Scrub Person: Leonard Schwartz, [place]  First Assistant: Phoebe Sharps  Second Assistant: Keturah Barre  Second Circulator: Evalina Field, RN    Operative Procedure:   Procedure(s):  ABDOMINOPLASTY, (COSMETIC) - circumferential   LIPOSUCTION, BACK (COSMETIC)  LIPOSUCTION, ABDOMEN (COSMETIC)  LIPOSUCTION, FLANK (COSMETIC)  INJECTION, FAT, (COSMETIC)- Buttocks     Preoperative Diagnosis:   Pre-Op Diagnosis Codes:     * Encounter for cosmetic surgery [Z41.1]    Postoperative Diagnosis:   Post-Op Diagnosis Codes:     * Encounter for cosmetic surgery [Z41.1]    Anesthesia:   General    Estimated Blood Loss:    600 mL    Implants:   * No implants in log *    Drains:   Drains: Yes, 3 drains    Specimens:   * No specimens in log *      Findings:   See op note    Complications:   None       Signed by: Ardeen Fillers, MD                                                                           [facility]

## 2020-10-07 NOTE — Transfer of Care (Signed)
Anesthesia Transfer of Care Note    Patient: Caitlin Morse    Procedures performed: Procedure(s) with comments:  ABDOMINOPLASTY, (COSMETIC) - weight for back/bilateral flanks and abdomen: 3410g  LIPOSUCTION, BACK (COSMETIC) - liposuction: (upper and lower back and bilateral flanks)   LIPOSUCTION, ABDOMEN (COSMETIC) - lipsuction:  LIPOSUCTION, FLANK (COSMETIC)  INJECTION, FAT, (COSMETIC) - fat transfer: PER BUTTOCK    Anesthesia type: General ETT    Patient location:Phase I PACU    Last vitals:   Vitals:    10/07/20 1608   BP: 145/68   Pulse: 86   Resp: 14   Temp:    SpO2: 100%       Post pain: Patient not complaining of pain, continue current therapy      Mental Status:awake    Respiratory Function: tolerating face mask    Cardiovascular: stable    Nausea/Vomiting: patient not complaining of nausea or vomiting    Hydration Status: adequate    Post assessment: no apparent anesthetic complications, no reportable events and no evidence of recall    Signed by: Francee Piccolo, CRNA  10/07/20 4:08 PM

## 2020-10-07 NOTE — Discharge Instructions (Signed)

## 2020-10-08 ENCOUNTER — Encounter: Payer: Self-pay | Admitting: Plastic Surgery

## 2020-10-13 ENCOUNTER — Emergency Department
Admission: EM | Admit: 2020-10-13 | Discharge: 2020-10-13 | Discharge status: Against Medical Advice | Payer: BC Managed Care – PPO | Attending: Emergency Medicine | Admitting: Emergency Medicine

## 2020-10-13 DIAGNOSIS — Z538 Procedure and treatment not carried out for other reasons: Secondary | ICD-10-CM | POA: Insufficient documentation

## 2020-10-13 DIAGNOSIS — G4700 Insomnia, unspecified: Secondary | ICD-10-CM | POA: Insufficient documentation

## 2020-10-13 DIAGNOSIS — Z139 Encounter for screening, unspecified: Secondary | ICD-10-CM

## 2020-10-13 NOTE — ED Provider Notes (Signed)
EMERGENCY DEPARTMENT NOTE    Physician/Midlevel provider first contact with patient: 10/13/20 0404         HISTORY OF PRESENT ILLNESS   Historian: Patient  Translator Used: None     40 y.o. female presents with insomnia. Patient states she takes Seroquel and has it at home but wanted to come to the ED for a dose. Denies SI, HI, Hallucinations        MEDICAL HISTORY     Past Medical History:  Past Medical History:   Diagnosis Date    Anemia 01/12/2018;07/2020    follows w/hematology most recent iron infusion 08/20/2020.    Anxiety     Pt states very anxious preop.    COVID-19 vaccine dose not administered     Pt states no COVID vaccine to date 10/05/2020.    Depression     hx-Not on a medication right now.     Encounter for blood transfusion 04/2017    01/18/18 [facility] - transfusing 2 units PRBC. No known issues.    Family history of DVT     Maternal Grandfather    Pap smear for cervical cancer screening 02/2017    normal     Snoring        Past Surgical History:  Past Surgical History:   Procedure Laterality Date    ABDOMINOPLASTY, (COSMETIC) N/A 10/07/2020    Procedure: ABDOMINOPLASTY, (COSMETIC);  Surgeon: Ardeen Fillers, MD;  Location: [facility];  Service: Plastics;  Laterality: N/A;  weight for back/bilateral flanks and abdomen: 3410g    CESAREAN SECTION  2007, 2012,2013, 2015    X 4    EGD  2010    EGD N/A 07/30/2019    Procedure: EGD;  Surgeon: Lidia Collum, MD;  Location: [facility];  Service: General;  Laterality: N/A;    HYSTEROSCOPY, ENDOMETRIAL ABLATION, NOVASURE N/A 01/19/2018    Procedure: HYSTEROSCOPY ENDOMETRIAL ABLATION;  Surgeon: Redmond School, MD;  Location: [facility];  Service: Gynecology;  Laterality: N/A;    INJECTION, FAT, (COSMETIC) Bilateral 10/07/2020    Procedure: INJECTION, FAT, (COSMETIC);  Surgeon: Ardeen Fillers, MD;  Location: [facility];  Service: Plastics;  Laterality: Bilateral;  fat transfer: PER BUTTOCK    INSERTION,  PAIN PUMP (MEDICAL)  07/30/2019    Procedure: INSERTION, PAIN PUMP (MEDICAL);  Surgeon: Lidia Collum, MD;  Location: [facility];  Service: General;;    LAPAROSCOPIC INSERTION ADJUSTABLE GASTRIC BAND  2010    LAPAROSCOPIC, GASTRIC BYPASS, CONVERSION W/ LAP BAND AND PORT REMOVAL N/A 07/30/2019    Procedure: LAPAROSCOPIC, SLEEVE GASTRECTOMY, CONVERSION FROM LAP BAND, HIATAL HERNIA REPAIR, LYSIS OF ADHESIONS;  Surgeon: Lidia Collum, MD;  Location: [facility];  Service: General;  Laterality: N/A;    LAPAROSCOPIC, SALPINGECTOMY Right 01/19/2018    Procedure: LAPAROSCOPIC, SALPINGECTOMY;  Surgeon: Redmond School, MD;  Location: [facility];  Service: Gynecology;  Laterality: Right;    LAPAROSCOPIC, TUBAL LIGATION N/A 01/19/2018    Procedure: LAPAROSCOPIC, TUBAL LIGATION;  Surgeon: Redmond School, MD;  Location: [facility];  Service: Gynecology;  Laterality: N/A;    LIPOSUCTION, ABDOMEN (COSMETIC) N/A 10/07/2020    Procedure: LIPOSUCTION, ABDOMEN (COSMETIC);  Surgeon: Ardeen Fillers, MD;  Location: [facility];  Service: Plastics;  Laterality: N/A;    LIPOSUCTION, BACK (COSMETIC) N/A 10/07/2020    Procedure: LIPOSUCTION, BACK (COSMETIC);  Surgeon: Ardeen Fillers, MD;  Location:  MAIN OR;  Service: Government social research officer;  Laterality: N/A;  liposuction: (upper and lower back and bilateral flanks)     LIPOSUCTION, FLANK (COSMETIC) Bilateral 10/07/2020    Procedure: LIPOSUCTION, FLANK (COSMETIC);  Surgeon: Ardeen Fillers, MD;  Location: [facility];  Service: Plastics;  Laterality: Bilateral;  liposcution flanks:       Social History:  Social History     Socioeconomic History    Marital status: Married   Tobacco Use    Smoking status: Former Smoker     Packs/day: 1.00     Years: 2.00     Pack years: 2.00     Types: Cigarettes     Quit date: 04/27/2020     Years since quitting: 0.4    Smokeless tobacco: Never Used   Vaping Use    Vaping Use: Never used    Substance and Sexual Activity    Alcohol use: Yes     Comment: < weekly basis/social only.    Drug use: Yes     Types: Marijuana     Comment: last 10/01/2019, occasional usage.       Family History:  Family History   Problem Relation Age of Onset    Diabetes Father     Hypertension Father        Outpatient Medication:  Previous Medications    BUPROPION (WELLBUTRIN) 100 MG TABLET    Take 100 mg by mouth 2 (two) times daily    DIAZEPAM (VALIUM) 2 MG TABLET    Take 2 mg by mouth every 8 (eight) hours as needed       IRON DEXTRAN IJ    Inject as directed as needed    PHENTERMINE (ADIPEX-P) 37.5 MG TABLET    Take 37.5 mg by mouth every morning    PHENTERMINE 30 MG CAPSULE    Take 30 mg by mouth every morning    QUETIAPINE (SEROQUEL) 25 MG TABLET    Take 25-37.5 mg by mouth nightly as needed          Allergies:  No Known Allergies      REVIEW OF SYSTEMS   Review of Systems   Unable to perform ROS: Other         PHYSICAL EXAM     Vitals:    10/13/20 0407   Resp: 14   Weight: 99.8 kg   Height: 5\' 6"  (1.676 m)           Exam declined      MEDICAL DECISION MAKING       Patient refuses vital signs. Declines examination by this provider requesting seroquel to sleep. Patient denies SI, HI, Hallucinations. History of anemia, depression/psychiatric illness. When asked to examine patient she reports "you just want to hear if I have two heartbeats and listen to my babies heartbeat" "I dont do metal on my body". Declines exam, VS, further workup. Left prior to treatment complete    DISCUSSION      Vital Signs: Reviewed the patients vital signs.   Nursing Notes: Reviewed and utilized available nursing notes.  Medical Records Reviewed: Reviewed available past medical records.  Counseling: The emergency provider has spoken with the patient and discussed todays findings, in addition to providing specific details for the plan of care.  Questions are answered and there is agreement with the plan.    IMAGING STUDIES    The following  imaging studies were independently interpreted by the Emergency Medicine Physician.  For full imaging study results please  see chart.    CARDIAC STUDIES     The following cardiac studies were independently interpreted by the Emergency Medicine Physician. For full cardiac study results please see chart     PULSE OXIMETRY    Oxygen Saturation by Pulse Oximetry:    Interventions:   Interpretation: declined    EMERGENCY DEPT. MEDICATIONS      ED Medication Orders (From admission, onward)    None          LABORATORY RESULTS    Ordered and independently interpreted AVAILABLE laboratory tests. Please see results section in chart for full details.      ATTESTATIONS        Physician Attestation: I, Dr Marnee Spring DO, have been the primary provider for Caitlin Morse during this Emergency Dept visit and have reviewed the chart for accuracy and agree with its content.       This note was generated by the Epic EMR system/ Dragon speech recognition and may contain inherent errors or omissions not intended by the user. Grammatical errors, random word insertions, deletions and pronoun errors  are occasional consequences of this technology due to software limitations. Not all errors are caught or corrected. If there are questions or concerns about the content of this note or information contained within the body of this dictation they should be addressed directly with the author for clarification.      DIAGNOSIS      Diagnosis:  Final diagnoses:   Encounter for medical screening examination   Insomnia, unspecified type       Disposition:  ED Disposition     ED Disposition Condition Date/Time Comment    Left prior to treatment complete  Tue Oct 13, 2020  4:20 AM           Prescriptions:  Patient's Medications   New Prescriptions    No medications on file   Previous Medications    BUPROPION (WELLBUTRIN) 100 MG TABLET    Take 100 mg by mouth 2 (two) times daily    DIAZEPAM (VALIUM) 2 MG TABLET    Take 2 mg by mouth every 8  (eight) hours as needed       IRON DEXTRAN IJ    Inject as directed as needed    PHENTERMINE (ADIPEX-P) 37.5 MG TABLET    Take 37.5 mg by mouth every morning    PHENTERMINE 30 MG CAPSULE    Take 30 mg by mouth every morning    QUETIAPINE (SEROQUEL) 25 MG TABLET    Take 25-37.5 mg by mouth nightly as needed      Modified Medications    No medications on file   Discontinued Medications    No medications on file

## 2020-10-13 NOTE — ED Triage Notes (Signed)
pt reports "I am just here to get some sleep". Refusing all vital signs. Denies complaints of pain. Requesting 50mg  Seroquel.

## 2020-10-14 ENCOUNTER — Emergency Department
Admission: EM | Admit: 2020-10-14 | Discharge: 2020-10-14 | Disposition: A | Discharge status: Home / Self Care | Payer: BC Managed Care – PPO | Attending: Internal Medicine | Admitting: Internal Medicine

## 2020-10-14 ENCOUNTER — Emergency Department
Admission: EM | Admit: 2020-10-14 | Discharge: 2020-10-14 | Disposition: A | Discharge status: Home / Self Care | Payer: BC Managed Care – PPO | Attending: Emergency Medicine | Admitting: Emergency Medicine

## 2020-10-14 DIAGNOSIS — Z5321 Procedure and treatment not carried out due to patient leaving prior to being seen by health care provider: Secondary | ICD-10-CM | POA: Insufficient documentation

## 2020-10-14 DIAGNOSIS — G8918 Other acute postprocedural pain: Secondary | ICD-10-CM | POA: Insufficient documentation

## 2020-10-14 LAB — CBC AND DIFFERENTIAL
Absolute NRBC: 0 10*3/uL (ref 0.00–0.00)
Absolute NRBC: 0.02 10*3/uL — ABNORMAL HIGH (ref 0.00–0.00)
Basophils Absolute Automated: 0.05 10*3/uL (ref 0.00–0.08)
Basophils Absolute Automated: 0.04 10*3/uL (ref 0.00–0.08)
Basophils Automated: 0.9 %
Basophils Automated: 0.7 %
Eosinophils Absolute Automated: 0.21 10*3/uL (ref 0.00–0.44)
Eosinophils Absolute Automated: 0.21 10*3/uL (ref 0.00–0.44)
Eosinophils Automated: 3.6 %
Eosinophils Automated: 3.8 %
Hematocrit: 27.1 % — ABNORMAL LOW (ref 34.7–43.7)
Hematocrit: 25.6 % — ABNORMAL LOW (ref 34.7–43.7)
Hgb: 8 g/dL — ABNORMAL LOW (ref 11.4–14.8)
Hgb: 7.7 g/dL — ABNORMAL LOW (ref 11.4–14.8)
Immature Granulocytes Absolute: 0.11 10*3/uL — ABNORMAL HIGH (ref 0.00–0.07)
Immature Granulocytes Absolute: 0.13 10*3/uL — ABNORMAL HIGH (ref 0.00–0.07)
Immature Granulocytes: 1.9 %
Immature Granulocytes: 2.4 %
Lymphocytes Absolute Automated: 1.65 10*3/uL (ref 0.42–3.22)
Lymphocytes Absolute Automated: 1.49 10*3/uL (ref 0.42–3.22)
Lymphocytes Automated: 28.1 %
Lymphocytes Automated: 27.3 %
MCH: 23.3 pg — ABNORMAL LOW (ref 25.1–33.5)
MCH: 23.7 pg — ABNORMAL LOW (ref 25.1–33.5)
MCHC: 29.5 g/dL — ABNORMAL LOW (ref 31.5–35.8)
MCHC: 30.1 g/dL — ABNORMAL LOW (ref 31.5–35.8)
MCV: 79 fL (ref 78.0–96.0)
MCV: 78.8 fL (ref 78.0–96.0)
MPV: 10.8 fL (ref 8.9–12.5)
MPV: 10.9 fL (ref 8.9–12.5)
Monocytes Absolute Automated: 0.49 10*3/uL (ref 0.21–0.85)
Monocytes Absolute Automated: 0.51 10*3/uL (ref 0.21–0.85)
Monocytes: 8.3 %
Monocytes: 9.3 %
Neutrophils Absolute: 3.37 10*3/uL (ref 1.10–6.33)
Neutrophils Absolute: 3.08 10*3/uL (ref 1.10–6.33)
Neutrophils: 57.2 %
Neutrophils: 56.5 %
Nucleated RBC: 0 /100 WBC (ref 0.0–0.0)
Nucleated RBC: 0.4 /100 WBC — ABNORMAL HIGH (ref 0.0–0.0)
Platelets: 354 10*3/uL — ABNORMAL HIGH (ref 142–346)
Platelets: 329 10*3/uL (ref 142–346)
RBC: 3.43 10*6/uL — ABNORMAL LOW (ref 3.90–5.10)
RBC: 3.25 10*6/uL — ABNORMAL LOW (ref 3.90–5.10)
RDW: 15 % (ref 11–15)
RDW: 15 % (ref 11–15)
WBC: 5.88 10*3/uL (ref 3.10–9.50)
WBC: 5.46 10*3/uL (ref 3.10–9.50)

## 2020-10-14 LAB — COMPREHENSIVE METABOLIC PANEL
ALT: 39 U/L (ref 0–55)
AST (SGOT): 30 U/L (ref 5–34)
Albumin/Globulin Ratio: 1.2 (ref 0.9–2.2)
Albumin: 3 g/dL — ABNORMAL LOW (ref 3.5–5.0)
Alkaline Phosphatase: 93 U/L (ref 37–106)
Anion Gap: 10 (ref 5.0–15.0)
BUN: 8 mg/dL (ref 7.0–19.0)
Bilirubin, Total: 0.3 mg/dL (ref 0.2–1.2)
CO2: 25 mEq/L (ref 22–29)
Calcium: 8.5 mg/dL (ref 8.5–10.5)
Chloride: 104 mEq/L (ref 100–111)
Creatinine: 0.6 mg/dL (ref 0.6–1.0)
Globulin: 2.5 g/dL (ref 2.0–3.6)
Glucose: 94 mg/dL (ref 70–100)
Potassium: 4 mEq/L (ref 3.5–5.1)
Protein, Total: 5.5 g/dL — ABNORMAL LOW (ref 6.0–8.3)
Sodium: 139 mEq/L (ref 136–145)

## 2020-10-14 LAB — GFR: EGFR: >60

## 2020-10-14 LAB — I-STAT WHOLE BLOOD B-HCG, TOTAL: i-stat Whole Blood B-HCG, Total: <5

## 2020-10-14 MED ORDER — HYDROMORPHONE HCL 2 MG PO TABS
2.0000 mg | ORAL_TABLET | Freq: Once | ORAL | Status: AC
Start: 2020-10-14 — End: 2020-10-14
  Administered 2020-10-14: 05:00:00 2 mg via ORAL
  Filled 2020-10-14: qty 1

## 2020-10-14 NOTE — Discharge Instructions (Signed)
Post-Operative Pain    You have been diagnosed with post-operative pain. This means pain after your surgery.    After evaluation today, your pain seems to be from your recent surgery. It may be a normal, expected event.    It is important to follow up with your surgeon. Call to make an appointment as soon as possible.    YOU SHOULD SEEK MEDICAL ATTENTION IMMEDIATELY, EITHER HERE OR AT THE NEAREST EMERGENCY DEPARTMENT, IF ANY OF THE FOLLOWING OCCURS:   Your pain gets worse.   Any fever (temperature higher than 100.4F / 38C)/chills, drainage from the incision or any new redness around the surgical site develops.   Any vomiting.   Unable to have a bowel movement (poop).
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
## 2020-10-14 NOTE — ED Triage Notes (Signed)
Pt arrived to triage in wheelchair, was just seen at [facility] ED. Reports she had tummy tuck and fat transfer on Feb 2nd. Reports pain and swelling to abdomen as well as chills. JP drainage bulbs draining blood since yesterday, clear drainage prior to that per pt.

## 2020-10-14 NOTE — ED Provider Notes (Signed)
I went into room to see patient and she was not there. ED RN informed me that she left dept. I did not see her and was not involved in her care in any way.      Duayne Cal, [place]  10/14/20 5409       Verlee Rossetti, MD  10/14/20 2203

## 2020-10-14 NOTE — EDIE (Signed)
COLLECTIVE?NOTIFICATION?10/14/2020 03:28?TANINA, BARB S?MRN: [id number]    Criteria Met      3 Different Facilities in 90 Days    Security and Safety  No recent Security Events currently on file    ED Care Guidelines  There are currently no ED Care Guidelines for this patient. Please check your facility's medical records system.        Prescription Monitoring Program  221??- Narcotic Use Score  120??- Sedative Use Score  140??- Stimulant Use Score  230??- Overdose Risk Score  - All Scores range from 000-999 with 75% of the population scoring < 200 and on 1% scoring above 650  - The last digit of the narcotic, sedative, and stimulant score indicates the number of active prescriptions of that type  - Higher Use scores correlate with increased prescribers, pharmacies, mg equiv, and overlapping prescriptions  - Higher Overdose Risk Scores correlate with increased risk of unintentional overdose death   Concerning or unexpectedly high scores should prompt a review of the PMP record; this does not constitute checking PMP for prescribing purposes.      E.D. Visit Count (12 mo.)  Facility Visits   [place] [facility] 1   [place] [facility] 1   [place] [facility] 1   Total 3   Note: Visits indicate total known visits.     Recent Emergency Department Visit Summary  Date Facility [facility] Type Diagnoses or Chief Complaint   Oct 14, 2020 [place]      Stomach Pain      Oct 14, 2020 [facility] [place] [facility] [place] [facility]      post op      Post-op Problem      Oct 13, 2020 [place] [facility]      bipolar      Insomnia      Insomnia, unspecified      Encounter for screening, unspecified          Recent Inpatient Visit Summary  No recorded inpatient visits.     Care Team  There is not a care team on record at this time.   Collective Portal  This patient has registered at the [facility] Emergency Department   For more information visit:  [email or web address]     PLEASE NOTE:     1.   Any care recommendations and other clinical information are provided as guidelines or for historical purposes only, and providers should exercise their own clinical judgment when providing care.    2.   You may only use this information for purposes of treatment, payment or health care operations activities, and subject to the limitations of applicable Collective Policies.    3.   You should consult directly with the organization that provided a care guideline or other clinical history with any questions about additional information or accuracy or completeness of information provided.    ? 2022 [place] - [email or web address]

## 2020-10-14 NOTE — ED Triage Notes (Signed)
tummy tuck and fat transfer on 02/02 with Dr. Renae Fickle.  pt here saying her pain is 8/10 on abd and bilateral sides. 3 JP drains present, appear to be draining. Concerned about swelling on the abd.  Last hydromorphone about 3 hrs ago, believes it is the 2mg  dose.
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
## 2020-10-19 NOTE — Op Note (Signed)
OPERATIVE REPORT     Caitlin Morse  Female, 40 y.o., 10-21-1980    MRN: [id number]  CSN: [id number]    Date of surgery: 10/07/20     Surgeon:     Alyce Pagan, MD     Procedure(s):  Cosmetic   Fleur de Lis Circumferential Abdominoplasty with muscle plication  Liposuction abdomen, flanks, back  Fat grafting to Buttocks      PREOPERATIVE DIAGNOSES:    Elective procedure for unacceptable cosmetic appearance [Z41.1]         POSTOPERATIVE DIAGNOSIS:  Elective procedure for unacceptable cosmetic appearance [Z41.1]     LOCAL ANESTHESIA  Tumescent solution 2000 mL     LIPOASPIRATE   2500 mL     Fat grafting   1100 mL ( 550 cc on each buttock)         SPECIMENS:   None     Abdominal pannus 3410 gm     DVT prophylaxis : LOvenox 40 mg and SCDs      INDICATIONS FOR SURGERY:  Caitlin Morse is a 40 year old female interested in improving her appearance. Patient is scheduled to undergo Fleur de Lis Circumferential Abdominoplasty with muscle plication, Liposuction abdomen, flanks, back and Fat grafting to Buttocks.  Procedures, risks and complications were discussed including, but not limited to scars, asymmetry, skin loss, change in size and shape with weight changes, bleeding, hematoma, infection, contour irregularities, and need for secondary procedures.  The risks associated with surgery in general, including but not limited to bleeding, infection, reaction to anesthesia, deep venous thrombosis, pneumonia, pulmonary embolus, and death were also discussed before surgery. All discussion points understood, and questions answered satisfactorily.      DESCRIPTION OF PROCEDURE:  In the preoperative area, the patient was marked while in the upright position.  Areas for liposuction were marked. Circumferential abdominoplasty incision was designed. She was then brought to the operating room, properly positioned on the operating room table in supine position, pressure points padded, sequential compression sleeves placed on her lower legs prior  to anesthesia administration. The appropriate time out/pause was completed with all staff present verifying the patient's identity, surgical procedure, surgical site, review of medications and allergies, and confirmation of required devices/equipment. She received IV antibiotics. She was administered general anesthesia.  Lovenox was given before induction of anesthesia      We then positioned the patient in a prone position. She was appropriately padded. Patients back was prepped and draped in a sterile fashion. We first turned our attention to the back and infiltrated the tumescent solution. After waiting for the effect of the epinephrine, SAFE liposuction was performed in the upper back, lower back and flanks. At the completion of the procedure, there was a good contour of the back. We then proceeded to excise excess skin and adipose tissue from the back. The lower incision was made and dissection was carried out superiorly. The excess skin was excised and the skin was closed in multiple layers with 2-0 Vicryl quilting sutures, 2-0 Vicryl Scarpas, barbed sutures deep dermal and 4-0 monocryl. Two drains were placed.We then proceeded with fat grafting to buttocks. Using a closed system, 550 cc of lipoaspirate were injected to each buttock.       The patient was then turned to supine position. We then turned our attention to the abdomen and flanks. Tumescent solution was injected, and SAFE liposuction was performed in the flanks and upper abdomen. 3- and 4-mm Mercedes cannula were used. Care was taken to perform  liposuction deep to the Scarpas fascia.      The premarked abdominal incision was made carried down to the fascia. The infraumbilical skin was elevated. The abdominoplasty was then carried out. The rectus diastasis was moderate. Muscle plication was performed with running double layer Stratafix. The fleur de Lis markings were reviewed and adjusted in order to provide tension free closure. A neoumbilicus  was designed with horizontal flaps (the skin edges were approximated with #3-0 Monocryl and 4-0 Chromic). The umbilicus was excised and the stump was closed with 3-0 Ethibond. The table was flexed slightly, the excess skin was removed, and the abdominal incision was closed in layers. The abdominal flap was secured to the fascia with quilting 2-0 Vicryl sutures. The fascial layers were closed with interrupted 2-0 Vicryl, deep dermal sutures with running barbed suture and subcuticular 4-0 Monocryl. A drain was placed. Dermabond perineo was applied to the incision.      At the completion of the surgical procedure, a debriefing was performed confirming the procedure performed. The instrument and lap tape/raytex count were correct.   She tolerated the procedure well, she was awakened from anesthesia and transferred from the operating room in satisfactory condition. I was present and performed the entire surgical procedure.       The procedures were completed without complication and patient tolerated well. The patient was taken to the recovery room in good condition.     Ardeen Fillers, MD

## 2020-10-20 NOTE — Anesthesia Postprocedure Evaluation (Signed)
Anesthesia Post Evaluation    Patient: Caitlin Morse    Procedure(s) with comments:  ABDOMINOPLASTY, (COSMETIC) - weight for back/bilateral flanks and abdomen: 3410g  LIPOSUCTION, BACK (COSMETIC) - liposuction: (upper and lower back and bilateral flanks)   LIPOSUCTION, ABDOMEN (COSMETIC)  LIPOSUCTION, FLANK (COSMETIC) - liposcution flanks:  INJECTION, FAT, (COSMETIC) - fat transfer: PER BUTTOCK    Anesthesia type: general    Last Vitals:   Vitals Value Taken Time   BP 134/74 10/07/20 1740   Temp 36.2 C (97.2 F) 10/07/20 1700   Pulse 72 10/07/20 1740   Resp 12 10/07/20 1740   SpO2 98 % 10/07/20 1740                 Anesthesia Post Evaluation:     Patient Evaluated: PACU  Patient Participation: complete - patient participated  Level of Consciousness: awake  Pain Score: 0  Pain Management: adequate    Airway Patency: patent    Anesthetic complications: No      PONV Status: none    Cardiovascular status: acceptable  Respiratory status: acceptable  Hydration status: acceptable        Signed by: Briant Cedar, MD, 10/20/2020 7:06 AM

## 2020-11-27 ENCOUNTER — Encounter: Payer: Self-pay | Admitting: Plastic Surgery

## 2020-11-27 ENCOUNTER — Ambulatory Visit: Payer: BC Managed Care – PPO | Attending: Plastic Surgery

## 2020-11-27 NOTE — Pre-Procedure Instructions (Addendum)
Surgical Risk Level : (Low, Intermediate, High)  [x]  Low  []  Intermediate  []  High     Surgeon Testing Requirements:  o none     Anesthesia Guideline Requirements:  o n/a     Specialist Notes / Test Results / Records Requested:  o n/a     Recent Hospitalization / ED Visit:   o 10/08/20 abd pain/swelling s/p abdominoplasty. No admission     Future Plan / Upcoming Appts:   o n/a     Labs/Testing @ [facility]:   o n/a        []  N/A     Email Sent To Patient:   o Provided PSS email [email or web address] or phone [phone] to patient/family member   o      NPO Instructions given to patient:    o NPO instructions reviewed: Clear liquids up to 2 hours prior to arrival time, then NPO. No solid food 8 hours prior to scheduled procedure time. Examples of clear liquids include water, apple juice, sports drinks such as Gatorade, coffee or tea without milk or cream. Sugar or sweetener may be added  o Fasting Requirements per Preoperative Fasting Guidelines for Elective Surgeries and Procedures Requiring Anesthesia Policy Revised 01/2020.  Ingested material Fasting requirement   Clear liquids/Ice Chips 2 hours prior to arrival time   Breast milk 4 hours prior to scheduled procedure time   Infant formula 6 hours prior to scheduled procedure time   Non-human milk 8 hours prior to scheduled procedure time   Solid food 8 hours prior to scheduled procedure time        Faxes Sent To:   o Pharmacy- DOS medication orders (if applicable)  o Actor h/p     Epic Orders Entered:   o Preop Nursing Anesthesia Orders  o      Other Outlying information gathered that does not fit anywhere else  o Patient does not refuse blood products but has specific instructionsn/a     Chart Room Handoff for Further  Follow-up if Applicable:  o n/a    Visitor Restriction Guidelines per [facility] as of 11/13/20:  All visitors must adhere to the following:    Exhibit no COVID-19 symptoms    Age 55+    In keeping with the CDCs  current guidance, regardless of vaccination status, everyone in a healthcare facility must wear a mask covering their mouth and nose the entire time they are in the facility. Visitors who fail to wear a mask properly will be asked to leave. The following face coverings cannot be worn at any [facility] location: gaiter style masks, bandanas or vented masks.    No visitors are allowed for patients with suspected or confirmed COVID-19, except in end-of-life situations.  Hospital Inpatient:   Visitation hours: 9:00 a.m. - 6:30 p.m. daily    Adult patients may have two (age 23+) per day.   Pediatric patients may have two parents/guardians at bedside 24/7.   Outpatient/Ambulatory Surgery:    Adult patients may have only one designated visitor (age 55+) to accompany the patient   For pediatric outpatients, only parents/guardians at bedside.    No family members/visitors will be allowed into Phase 1 recovery areas. Physicians will call contact person to give report of procedure.    Family / visitor will be called by PACU staff  to review discharge instructions via phone and answer any questions.     Advise to call surgeon if need to cancel surgery  arises.      Patient verbalized understanding and acceptance of above information.

## 2020-12-03 NOTE — Progress Notes (Signed)
Day Before Surgery Confirmation Call    Spoke NF:[id number]  Left Message:    Confirmed surgery date, arrival time, and location.   Arrival: 0630  Surgery: 0830    NPO instructions reviewed: Clear liquids up to 2 hours prior to arrival time, then NPO. No solid food 8 hours prior to scheduled procedure time. Examples of clear liquids include water, apple juice, sports drinks such as Gatorade, coffee or tea without milk or cream. Sugar or sweetener may be added    Fasting Requirements per Preoperative Fasting Guidelines for Elective Surgeries and Procedures Requiring Anesthesia Policy Revised 01/2020  Ingested material Fasting requirement   Clear liquids/Ice Chips 2 hours prior to arrival time   Breast milk 4 hours prior to scheduled procedure time   Infant formula 6 hours prior to scheduled procedure time   Non-human milk 8 hours prior to scheduled procedure time   Solid food 8 hours prior to scheduled procedure time       Ambulatory Screening Tool:   Patient instructed that if you or your family does develop new symptoms of acute respiratory illness, to contact surgeons office immediately, prior to arrival at the hospital.     Patient denies recent visit to ED, hospitalization or PCP visit for acute illness since PSS RN interview.     Visitor Restriction Guidelines per [facility] as of 11/13/20:  All visitors must adhere to the following:    Exhibit no COVID-19 symptoms    Age 44+    In keeping with the CDCs current guidance, regardless of vaccination status, everyone in a healthcare facility must wear a mask covering their mouth and nose the entire time they are in the facility. Visitors who fail to wear a mask properly will be asked to leave. The following face coverings cannot be worn at any [place] location: gaiter style masks, bandanas or vented masks.    No visitors are allowed for patients with suspected or confirmed COVID-19, except in end-of-life situations.  Hospital Inpatient:   Visitation hours:  9:00 a.m. - 6:30 p.m. daily    Adult patients may have two (age 26+) per day.   Pediatric patients may have two parents/guardians at bedside 24/7.   Outpatient/Ambulatory Surgery:    Adult patients may have only one designated visitor (age 42+) to accompany the patient   For pediatric outpatients, only parents/guardians at bedside.    No family members/visitors will be allowed into Phase 1 recovery areas. Physicians will call contact person to give report of procedure.    Family / visitor will be called by PACU staff  to review discharge instructions via phone and answer any questions.     Advise to call surgeon if need to cancel surgery arises.     Patient verbalized understanding and acceptance of above information.  If a voicemail is left for the patient and any of the COVID19 ambulatory screening questions are positive, patient is asked to call PSS ASAP.

## 2020-12-04 ENCOUNTER — Encounter: Payer: Self-pay | Admitting: Plastic Surgery

## 2020-12-04 ENCOUNTER — Ambulatory Visit: Payer: Self-pay | Admitting: Certified Registered Nurse Anesthetist

## 2020-12-04 ENCOUNTER — Encounter
Admission: RE | Disposition: A | Discharge status: Home / Self Care | Payer: Self-pay | Source: Ambulatory Visit | Attending: Plastic Surgery

## 2020-12-04 ENCOUNTER — Ambulatory Visit
Admission: RE | Admit: 2020-12-04 | Discharge: 2020-12-04 | Disposition: A | Discharge status: Home / Self Care | Payer: Self-pay | Source: Ambulatory Visit | Attending: Plastic Surgery | Admitting: Plastic Surgery

## 2020-12-04 DIAGNOSIS — Z411 Encounter for cosmetic surgery: Secondary | ICD-10-CM | POA: Insufficient documentation

## 2020-12-04 HISTORY — PX: LIFT, THIGH (COSMETIC): SHX4711

## 2020-12-04 HISTORY — PX: LIPOSUCTION, THIGHS (COSMETIC): SHX4757

## 2020-12-04 HISTORY — PX: LIPOSUCTION, BACK (COSMETIC): SHX4738

## 2020-12-04 HISTORY — PX: INJECTION, FAT, (COSMETIC): SHX9537

## 2020-12-04 LAB — URINE HCG QUALITATIVE: Urine HCG Qualitative: NEGATIVE

## 2020-12-04 SURGERY — LIFT, THIGH (COSMETIC)
Anesthesia: Anesthesia General | Site: Leg Upper | Wound class: Clean

## 2020-12-04 MED ORDER — LABETALOL HCL 5 MG/ML IV SOLN (WRAP)
INTRAVENOUS | Status: AC
Start: 2020-12-04 — End: ?
  Filled 2020-12-04: qty 20

## 2020-12-04 MED ORDER — PROPOFOL 10 MG/ML IV EMUL (WRAP)
INTRAVENOUS | Status: AC
Start: 2020-12-04 — End: ?
  Filled 2020-12-04: qty 20

## 2020-12-04 MED ORDER — ONDANSETRON HCL 4 MG/2ML IJ SOLN
4.0000 mg | Freq: Once | INTRAMUSCULAR | Status: DC | PRN
Start: 2020-12-04 — End: 2020-12-04

## 2020-12-04 MED ORDER — FENTANYL CITRATE (PF) 50 MCG/ML IJ SOLN (WRAP)
25.0000 ug | INTRAMUSCULAR | Status: AC | PRN
Start: 2020-12-04 — End: 2020-12-04
  Administered 2020-12-04 (×4): 25 ug via INTRAVENOUS
  Filled 2020-12-04: qty 2

## 2020-12-04 MED ORDER — ACETAMINOPHEN 325 MG PO TABS
650.0000 mg | ORAL_TABLET | Freq: Four times a day (QID) | ORAL | Status: DC | PRN
Start: 2020-12-04 — End: 2020-12-04

## 2020-12-04 MED ORDER — LIDOCAINE HCL 2 % IJ SOLN
INTRAMUSCULAR | Status: DC | PRN
Start: 2020-12-04 — End: 2020-12-04
  Administered 2020-12-04: 60 mg

## 2020-12-04 MED ORDER — HYDROMORPHONE HCL 1 MG/ML IJ SOLN
INTRAMUSCULAR | Status: AC
Start: 2020-12-04 — End: ?
  Filled 2020-12-04: qty 1

## 2020-12-04 MED ORDER — DEXAMETHASONE SODIUM PHOSPHATE 4 MG/ML IJ SOLN
INTRAMUSCULAR | Status: AC
Start: 2020-12-04 — End: ?
  Filled 2020-12-04: qty 2

## 2020-12-04 MED ORDER — CEFAZOLIN SODIUM 1 G IJ SOLR
INTRAMUSCULAR | Status: AC
Start: 2020-12-04 — End: ?
  Filled 2020-12-04: qty 2000

## 2020-12-04 MED ORDER — OXYCODONE HCL 5 MG PO TABS
5.0000 mg | ORAL_TABLET | Freq: Once | ORAL | Status: AC | PRN
Start: 2020-12-04 — End: 2020-12-04
  Administered 2020-12-04: 5 mg via ORAL
  Filled 2020-12-04: qty 1

## 2020-12-04 MED ORDER — DEXTROSE 5 % IV SOLN
2.0000 g | Freq: Three times a day (TID) | INTRAVENOUS | Status: DC
Start: 2020-12-04 — End: 2020-12-04
  Administered 2020-12-04 (×2): 2 g via INTRAVENOUS

## 2020-12-04 MED ORDER — ACETAMINOPHEN 500 MG PO TABS
1000.0000 mg | ORAL_TABLET | Freq: Four times a day (QID) | ORAL | Status: DC | PRN
Start: 2020-12-04 — End: 2020-12-04

## 2020-12-04 MED ORDER — PROPOFOL INFUSION 10 MG/ML
INTRAVENOUS | Status: DC | PRN
Start: 2020-12-04 — End: 2020-12-04
  Administered 2020-12-04: 14:00:00 50 ug/kg/min via INTRAVENOUS

## 2020-12-04 MED ORDER — LACTATED RINGERS IV SOLN
INTRAVENOUS | Status: DC
Start: 2020-12-04 — End: 2020-12-04

## 2020-12-04 MED ORDER — NEOSTIGMINE METHYLSULFATE 1 MG/ML IJ/IV SOLN (WRAP)
Status: DC | PRN
Start: 2020-12-04 — End: 2020-12-04
  Administered 2020-12-04: 3 mg via INTRAVENOUS

## 2020-12-04 MED ORDER — DEXMEDETOMIDINE HCL IN NACL 200 MCG/50ML IV SOLN
INTRAVENOUS | Status: AC
Start: 2020-12-04 — End: ?
  Filled 2020-12-04: qty 50

## 2020-12-04 MED ORDER — BUPIVACAINE-EPINEPHRINE (PF) 0.25% -1:200000 IJ SOLN
INTRAMUSCULAR | Status: AC
Start: 2020-12-04 — End: ?
  Filled 2020-12-04: qty 60

## 2020-12-04 MED ORDER — ONDANSETRON HCL 4 MG/2ML IJ SOLN
INTRAMUSCULAR | Status: DC | PRN
Start: 2020-12-04 — End: 2020-12-04
  Administered 2020-12-04: 4 mg via INTRAVENOUS

## 2020-12-04 MED ORDER — MIDAZOLAM HCL 1 MG/ML IJ SOLN (WRAP)
INTRAMUSCULAR | Status: AC
Start: 2020-12-04 — End: ?
  Filled 2020-12-04: qty 2

## 2020-12-04 MED ORDER — LABETALOL HCL 5 MG/ML IV SOLN (WRAP)
10.0000 mg | Freq: Once | INTRAVENOUS | Status: AC
Start: 2020-12-04 — End: 2020-12-04

## 2020-12-04 MED ORDER — PROPOFOL INFUSION 10 MG/ML
INTRAVENOUS | Status: DC | PRN
Start: 2020-12-04 — End: 2020-12-04
  Administered 2020-12-04 (×3): 50 mg via INTRAVENOUS
  Administered 2020-12-04: 170 mg via INTRAVENOUS

## 2020-12-04 MED ORDER — LIDOCAINE HCL (PF) 0.5 % IJ SOLN
INTRAMUSCULAR | Status: AC
Start: 2020-12-04 — End: ?
  Filled 2020-12-04: qty 50

## 2020-12-04 MED ORDER — LIDOCAINE HCL (PF) 2 % IJ SOLN
INTRAMUSCULAR | Status: AC
Start: 2020-12-04 — End: ?
  Filled 2020-12-04: qty 5

## 2020-12-04 MED ORDER — DEXTROSE 5 % IV SOLN
2.0000 g | INTRAVENOUS | Status: DC
Start: 2020-12-04 — End: 2020-12-04
  Filled 2020-12-04: qty 2000

## 2020-12-04 MED ORDER — HYDROMORPHONE HCL 1 MG/ML IJ SOLN
INTRAMUSCULAR | Status: DC | PRN
Start: 2020-12-04 — End: 2020-12-04
  Administered 2020-12-04 (×5): .5 mg via INTRAVENOUS

## 2020-12-04 MED ORDER — TRANEXAMIC ACID 1000 MG/10ML IV SOLN
INTRAVENOUS | Status: DC | PRN
Start: 2020-12-04 — End: 2020-12-04
  Administered 2020-12-04: 10:00:00 1000 mL via SUBCUTANEOUS

## 2020-12-04 MED ORDER — TRANEXAMIC ACID 1000 MG/10ML IV SOLN
INTRAVENOUS | Status: AC
Start: 2020-12-04 — End: ?
  Filled 2020-12-04: qty 30

## 2020-12-04 MED ORDER — NEOSTIGMINE METHYLSULFATE 1 MG/ML IJ/IV SOLN (WRAP)
Status: AC
Start: 2020-12-04 — End: ?
  Filled 2020-12-04: qty 10

## 2020-12-04 MED ORDER — PROMETHAZINE HCL 25 MG/ML IJ SOLN
6.2500 mg | Freq: Once | INTRAMUSCULAR | Status: DC | PRN
Start: 2020-12-04 — End: 2020-12-04

## 2020-12-04 MED ORDER — LABETALOL HCL 5 MG/ML IV SOLN (WRAP)
20.0000 mg | Freq: Once | INTRAVENOUS | Status: DC
Start: 2020-12-04 — End: 2020-12-04

## 2020-12-04 MED ORDER — ONDANSETRON HCL 4 MG/2ML IJ SOLN
INTRAMUSCULAR | Status: AC
Start: 2020-12-04 — End: ?
  Filled 2020-12-04: qty 2

## 2020-12-04 MED ORDER — ENOXAPARIN SODIUM 40 MG/0.4ML SC SOLN
40.0000 mg | SUBCUTANEOUS | Status: DC
Start: 2020-12-04 — End: 2020-12-04
  Administered 2020-12-04: 09:00:00 40 mg via SUBCUTANEOUS

## 2020-12-04 MED ORDER — LIDOCAINE HCL 1 % IJ SOLN
INTRAMUSCULAR | Status: AC
Start: 2020-12-04 — End: ?
  Filled 2020-12-04: qty 50

## 2020-12-04 MED ORDER — ENOXAPARIN SODIUM 40 MG/0.4ML SC SOLN
SUBCUTANEOUS | Status: AC
Start: 2020-12-04 — End: ?
  Filled 2020-12-04: qty 0.4

## 2020-12-04 MED ORDER — GLYCOPYRROLATE 1 MG/5ML IJ SOLN
INTRAMUSCULAR | Status: AC
Start: 2020-12-04 — End: ?
  Filled 2020-12-04: qty 5

## 2020-12-04 MED ORDER — SODIUM CHLORIDE 0.9 % IV SOLN
INTRAVENOUS | Status: DC | PRN
Start: 2020-12-04 — End: 2020-12-04
  Administered 2020-12-04 (×7): 4 ug via INTRAVENOUS

## 2020-12-04 MED ORDER — SODIUM CHLORIDE 0.9% BAG (IRRIGATION USE)
INTRAVENOUS | Status: DC | PRN
Start: 2020-12-04 — End: 2020-12-04
  Administered 2020-12-04: 1000 mL

## 2020-12-04 MED ORDER — BUPIVACAINE HCL (PF) 0.25 % IJ SOLN
INTRAMUSCULAR | Status: AC
Start: 2020-12-04 — End: ?
  Filled 2020-12-04: qty 60

## 2020-12-04 MED ORDER — GLYCOPYRROLATE 0.2 MG/ML IJ SOLN (WRAP)
INTRAMUSCULAR | Status: DC | PRN
Start: 2020-12-04 — End: 2020-12-04
  Administered 2020-12-04: .6 mg via INTRAVENOUS

## 2020-12-04 MED ORDER — ROCURONIUM BROMIDE 10 MG/ML IV SOLN (WRAP)
INTRAVENOUS | Status: DC | PRN
Start: 2020-12-04 — End: 2020-12-04
  Administered 2020-12-04: 30 mg via INTRAVENOUS
  Administered 2020-12-04: 10 mg via INTRAVENOUS

## 2020-12-04 MED ORDER — DEXAMETHASONE SODIUM PHOSPHATE 4 MG/ML IJ SOLN (WRAP)
INTRAMUSCULAR | Status: DC | PRN
Start: 2020-12-04 — End: 2020-12-04
  Administered 2020-12-04: 8 mg via INTRAVENOUS

## 2020-12-04 MED ORDER — MIDAZOLAM HCL 1 MG/ML IJ SOLN (WRAP)
INTRAMUSCULAR | Status: DC | PRN
Start: 2020-12-04 — End: 2020-12-04
  Administered 2020-12-04: 2 mg via INTRAVENOUS

## 2020-12-04 MED ORDER — TRANEXAMIC ACID 1000 MG/10ML IV SOLN
INTRAVENOUS | Status: AC
Start: 2020-12-04 — End: ?
  Filled 2020-12-04: qty 10

## 2020-12-04 MED ORDER — LABETALOL HCL 5 MG/ML IV SOLN (WRAP)
INTRAVENOUS | Status: AC
Start: 2020-12-04 — End: 2020-12-04
  Administered 2020-12-04: 17:00:00 10 mg via INTRAVENOUS
  Filled 2020-12-04: qty 20

## 2020-12-04 MED ORDER — HYDROMORPHONE HCL 0.5 MG/0.5 ML IJ SOLN
0.2000 mg | INTRAMUSCULAR | Status: DC | PRN
Start: 2020-12-04 — End: 2020-12-04
  Filled 2020-12-04: qty 0.5

## 2020-12-04 MED ORDER — LABETALOL HCL 5 MG/ML IV SOLN (WRAP)
INTRAVENOUS | Status: DC | PRN
Start: 2020-12-04 — End: 2020-12-04
  Administered 2020-12-04 (×2): 5 mg via INTRAVENOUS

## 2020-12-04 MED ORDER — EPINEPHRINE HCL 1 MG/ML IJ SOLN (WRAP)
Status: AC
Start: 2020-12-04 — End: ?
  Filled 2020-12-04: qty 5

## 2020-12-04 MED ORDER — ACETAMINOPHEN 500 MG PO TABS
ORAL_TABLET | ORAL | Status: AC
Start: 2020-12-04 — End: 2020-12-04
  Administered 2020-12-04: 18:00:00 1000 mg via ORAL
  Filled 2020-12-04: qty 2

## 2020-12-04 MED ORDER — ROCURONIUM BROMIDE 50 MG/5ML IV SOLN
INTRAVENOUS | Status: AC
Start: 2020-12-04 — End: ?
  Filled 2020-12-04: qty 5

## 2020-12-04 MED ORDER — BACITRACIN ZINC 500 UNIT/GM EX OINT
TOPICAL_OINTMENT | CUTANEOUS | Status: AC
Start: 2020-12-04 — End: ?
  Filled 2020-12-04: qty 28.35

## 2020-12-04 SURGICAL SUPPLY — 198 items
ADHESIVE LIQUID WATERPROOF VIAL PREP NONSTAIN MASTISOL STYRAX GUM (Skin Closure) ×3 IMPLANT
ADHESIVE LQ STYRAX GUM MASTIC ALC MTHY (Skin Closure) ×3
ADHESIVE SKIN CLOSURE DERMABOND ADVANCED (Skin Closure) ×18
ADHESIVE SKIN CLOSURE DERMABOND ADVANCED .7 ML LIQUID APPLICATOR (Skin Closure) ×18 IMPLANT
ADHESIVE SKIN DERMABOND ADV (Skin Closure) ×6
BANDAGE COFLEX NL COMPRESSION L5 YD X W6 (Procedure Accessories) ×3
BANDAGE COFLEX NL COMPRESSION L5 YD X W6 IN COHESIVE SELF ADHERENT TAN (Procedure Accessories) ×3 IMPLANT
BANDAGE STERI-STRIP 0.5X4IN (Dressing) ×1
BINDER ABDOM 12 WIDE 75X84 XL (Patient Supply)
BINDER ABDOMINAL H12 IN SMALL MEDIUM (Patient Supply)
BINDER ABDOMINAL H12 IN SMALL MEDIUM STANDARD 4 PANEL ELASTIC FLEXIBLE (Patient Supply) IMPLANT
BINDER ABDOMINAL W12 IN XL BARIATRIC (Patient Supply)
BINDER ABDOMINAL W12 IN XL BARIATRIC MULTIPANEL HOOK LOOP CLOSURE (Patient Supply) IMPLANT
BINDR ABD 12IN 4PNL STD SM MED (Patient Supply)
BLADE 10 BARD-PARKER RIB-BACK CARBON (Blade) ×24
BLADE 10 BARD-PARKER RIB-BACK CARBON STEEL TISSUE SURGICAL (Blade) ×24 IMPLANT
BLADE 15 CLASSIC CARBON STEEL TISSUE (Blade) ×18
BLADE 15 CLASSIC CARBON STEEL TISSUE SURGICAL (Blade) ×18 IMPLANT
BLADE S/SU RIBBACK CARB STL 15 (Blade) ×6
BLADE SURG 10 (Blade) ×8
BLANKET UNDERBODY BH ADULT (Other) ×1
BLANKET WARMING L74 IN X W36 IN BAIR (Other) ×3
BLANKET WARMING L74 IN X W36 IN BAIR HUGGER POLYMER ADULT 5 OZ (Other) ×3 IMPLANT
BULB DRAINAGE LIGHTWEIGHT LOW LEVEL (Drain) ×6
BULB DRAINAGE LIGHTWEIGHT LOW LEVEL SUCTION RELIAVAC SILICONE 100 CC (Drain) ×6 IMPLANT
CANISTER SCT 3L 60ML LIPOFILTER STRL 2 (Other)
CANISTER SUC 3L 60ML TBG 2CH (Other)
CANISTER SUCTION 3 L 60 ML 2 CHAMBER ASPIRATION FAT HARVEST SYRINGE (Other) IMPLANT
CANNULA LIPOSUCTION L30 CM BENT FLARE (Cannula) ×3
CANNULA LIPOSUCTION L30 CM BENT FLARE OD5 MM MERCEDES L10 MM (Cannula) ×3 IMPLANT
CANNULA MICROAIRE 5.0 (Cannula) ×1
CANNULA SUCTION L30 CM TRIPORT II TIP (Cannula) ×3
CANNULA SUCTION L30 CM TRIPORT II TIP OD4 MM PAL L10 MM POWER ASSISTED (Cannula) ×3 IMPLANT
CANNULA TRIPORT II 4X10MMX30CM (Cannula) ×1
CAP COLEMAN LUER LOK 6/PKG (Adapter) ×2
CAP SYRINGE LUER LOCK COLEMAN (Adapter) ×6 IMPLANT
COVER EQUIPMENT MEDLINE SMS L53 IN X W24 (Drape) ×6
COVER EQUIPMENT MEDLINE SMS L53 IN X W24 IN MAYOSTAND (Drape) ×6 IMPLANT
COVER FLEXIBLE LIGHT HANDLE PLASTIC GREEN (Procedure Accessories) ×3 IMPLANT
COVER FLEXIBLE MEDLINE LIGHT HANDLE (Procedure Accessories) ×3
COVER MAYO STAND STRL (Drape) ×2
CVR LGHTHNDL FLEX SFT 1PK (Procedure Accessories) ×1
DECANTER 9 BAG (Procedure Accessories) ×1
DECANTER FLUID L9 IN BAG MEDLINE WHITE (Procedure Accessories) ×3
DECANTER FLUID L9 IN BAG WHITE (Procedure Accessories) ×3 IMPLANT
DRAIN BLAKE RND 15F SILI HBLSS (Drain) ×2
DRAIN OD15 FR 4 CHANNEL RADIOPAQUE (Drain) ×6
DRAIN OD15 FR 4 CHANNEL RADIOPAQUE HUBLESS SOLID CORE CENTER BLAKE (Drain) ×6 IMPLANT
DRAN EVACUATOR WOUND 100CC (Drain) ×2
DRAPE 66X44IN POLYURETHANE WARMER ORS ORS FLUID WARMING STRL DSPSBL (Drape) ×3 IMPLANT
DRAPE FLUID WARMER 44X66IN (Drape) ×1
DRAPE SURG 3/4 53X77IN (Drape) ×6
DRAPE SURGICAL SHEET L77 IN X W53 IN (Drape) ×18
DRAPE SURGICAL SHEET L77 IN X W53 IN MEDLINE SMS 3/4 BLUE (Drape) ×18 IMPLANT
DRAPE WRMR PU ORS 66X44IN STRL DISP ORS (Drape) ×3
DRESSING TEGADERM 4X4X3/4IN (Dressing) ×2
DRESSING TEGADERM FRAME 6X7CM (Dressing) ×2
DRESSING TELFA 3X8IN STERILE (Dressing) ×24 IMPLANT
DRESSING TRANSPARENT L2 3/4 IN X W2 3/8 (Dressing) ×6
DRESSING TRANSPARENT L2 3/4 IN X W2 3/8 IN POLYURETHANE ADHESIVE (Dressing) ×6 IMPLANT
DRESSING TRANSPARENT L4 3/4 IN X W4 IN (Dressing) ×6
DRESSING TRANSPARENT L4 3/4 IN X W4 IN POLYURETHANE ADHESIVE (Dressing) ×6 IMPLANT
ELECTRODE ELECTROSURGICAL L2 IN OD.4 IN (Neuro Supply) ×3
ELECTRODE ELECTROSURGICAL L2 IN OD.4 IN EPITOME STANDARD SHAFT SCALPEL (Neuro Supply) ×3 IMPLANT
ELECTRODE EPITOME STD 2IN .4MM (Neuro Supply) ×1
GLOVE BIOGEL PI ORTHOPRO SZ8 (Glove) ×6
GLOVE BIOGEL SURG SENSE SZ 6.5 (Glove) ×1
GLOVE ORTHO SIZE 71/2 (Glove) ×6
GLOVE SURGICAL 6 1/2 BIOGEL SENSOR (Glove) ×3
GLOVE SURGICAL 6 1/2 BIOGEL SENSOR POWDER FREE TEXTURE BEAD CUFF STRAW (Glove) ×3 IMPLANT
GLOVE SURGICAL 7 1/2 BIOGEL PI ORTHOPRO (Glove) ×18
GLOVE SURGICAL 7 1/2 BIOGEL PI ORTHOPRO POWDER FREE MICRO ROUGH (Glove) ×18 IMPLANT
GLOVE SURGICAL 7 BIOGEL PI INDICATOR (Glove) ×3
GLOVE SURGICAL 7 BIOGEL PI INDICATOR UNDERGLOVE POWDER FREE SMOOTH (Glove) ×3 IMPLANT
GLOVE SURGICAL 8 BIOGEL PI ORTHOPRO (Glove) ×18
GLOVE SURGICAL 8 BIOGEL PI ORTHOPRO POWDER FREE MICRO ROUGH TEXTURE (Glove) ×18 IMPLANT
GOWN PREVNTN+ LARGE STRL (Gown) ×1
GOWN SURGICAL L BLUE AAMI LEVEL 4 BREATHABLE CSR WRAP PROTECTION (Gown) ×3 IMPLANT
GOWN SURGICAL L MEDLINE BLUE AAMI LEVEL (Gown) ×3
HANDLE LIGHT ALC PLUS DISPOSABLE STERILE (Procedure Accessories) ×1
HANDLE LIGHT SNAP ON ASPEN (Procedure Accessories) ×3 IMPLANT
JELLY LUB EZ LF STRL H2O SOL NGRS TRNLU (Irrigation Solutions) ×3 IMPLANT
JELLY LUBE STRL FLPTOP 4OZ (Irrigation Solutions) ×1
KIT INFECTION CONTROL CUSTOM (Kits) ×4
KIT INFECTION CONTROL CUSTOM IFOH03 (Kits) ×3 IMPLANT
LACTATED RINGERS 1000ML (IV Solutions)
LEGGINGS SURGICAL L34 IN ADJUSTABLE (Procedure Accessories) ×3
LEGGINGS SURGICAL L34 IN ADJUSTABLE DRAPE THERMAL PREVENT THERMO-LITE (Procedure Accessories) ×3 IMPLANT
LEGGINGS THERMOFLECT SILVER (Procedure Accessories) ×1
MASTISOL VIAL 2/3CC STRL (Skin Closure) ×1
MRKR SKN W RULER AND LABELS (Positioning Supplies) ×8
NEEDLE 18GA 1-1/2IN (Needles) ×1
NEEDLE 25GA 1 1/2 (Needles) ×1
NEEDLE HPO PP RW BD PRCSNGL 18GA 1.5IN (Needles) ×3 IMPLANT
NEEDLE HPO SS PP RW BD 25GA 1.5IN LF (Needles) ×3 IMPLANT
NEEDLE SPINAL 20G X 3.5IN (Needles) ×2
NEEDLE SPINAL L3 1/2 IN REGULAR WALL QUINCKE TIP OD20 GA BD (Needles) ×6 IMPLANT
NEEDLE SPNL PP RW BD QNCK 20GA 3.5IN LF (Needles) ×6
PAD ABD PVC CRTY 9X5IN LF STRL 3 LYR (Dressing) ×21 IMPLANT
PAD ARMBOARD FOAM 20X8X2IN (Positioning Supplies) ×1
PAD ARMBOARD L20 IN X W8 IN X H2 IN (Positioning Supplies) ×3
PAD ARMBOARD L20 IN X W8 IN X H2 IN CONVOLUTE FOAM PURPLE (Positioning Supplies) ×3 IMPLANT
PAD PREP CUFF 24X41IN W 9IN (Prep) ×2
PAD SRGPRP 44X24IN NS CUF 9IN (Prep) ×6 IMPLANT
PAD TENDRSRB ABD STRL 5X9IN (Dressing) ×7
PEN SURGICAL MARKING MEDLINE SKIN RULER (Positioning Supplies) ×24
PEN SURGICAL MARKING SKIN RULER LARGE RESERVOIR REGULAR TIP LABEL (Positioning Supplies) ×24 IMPLANT
PENCIL SMOKE EVAC 70MM PSH BTN (Cautery) ×1
PENCIL SMOKE EVACUATOR COATED PUSH (Cautery) ×3
PENCIL SMOKE EVACUATOR COATED PUSH BUTTON NEPTUNE E-SEP (Cautery) ×3 IMPLANT
SOLUTION IV LACTATED RINGERS 1000 ML (IV Solutions)
SOLUTION IV LACTATED RINGERS 1000 ML PLASTIC CONTAINER (IV Solutions) IMPLANT
SPONGE GAUZE L2 IN X W2 IN 8 PLY PEEL (Dressing) ×18 IMPLANT
SPONGE GZE STR 2'S 8PLY 2X2 (Dressing) ×6
SPONGE LAPAROTOMY 18X18IN (Sponge) ×2
SPONGE LAPAROTOMY L18 IN X W18 IN 4 PLY (Sponge) ×6
SPONGE LAPAROTOMY L18 IN X W18 IN 4 PLY RADIOPAQUE PYRONEMA FREE HIGH (Sponge) ×6 IMPLANT
STAPLER APPOSE ULC 35 W SKIN (Staplers) ×1
STAPLER SKIN L3.9 MM X W6.9 MM WIDE 35 (Skin Closure) ×15
STAPLER SKIN L3.9 MM X W6.9 MM WIDE 35 COUNT FIX HEAD RATCHET (Skin Closure) ×15 IMPLANT
STAPLER SKIN L4.1 MM X W6.5 MM 35 WIDE (Staplers) ×3
STAPLER SKIN L4.1 MM X W6.5 MM 35 WIDE STAPLE CARTRIDGE APPOSE ULC (Staplers) ×3 IMPLANT
STAPLER SKIN PROXIMATE WIDE (Skin Closure) ×5
STRIP SKIN CLOSURE L4 IN X W1/2 IN (Dressing) ×3
STRIP SKIN CLOSURE L4 IN X W1/2 IN REINFORCE STERI-STRIP POLYESTER (Dressing) ×3 IMPLANT
SUTURE 3-0 FS-1 24MM (Suture) ×2
SUTURE COATED VICRYL 0 CT-1 L27 IN BRAID (Suture) ×18
SUTURE COATED VICRYL 0 CT-1 L27 IN BRAID COATED UNDYED ABSORBABLE (Suture) ×18 IMPLANT
SUTURE COATED VICRYL 2-0 CT-1 L36 IN (Suture) ×12
SUTURE COATED VICRYL 2-0 CT-1 L36 IN BRAID COATED UNDYED ABSORBABLE (Suture) ×12 IMPLANT
SUTURE ETHILON 5-0 FS2 18IN (Suture) ×1
SUTURE ETHILON BLACK 3-0 FS-1 L18 IN (Suture) ×6 IMPLANT
SUTURE ETHILON BLACK 5-0 FS-2 L18 IN (Suture) ×3
SUTURE ETHILON BLACK 5-0 FS-2 L18 IN MONOFILAMENT NONABSORBABLE (Suture) ×3 IMPLANT
SUTURE MONOCRYL 3-0 PS-2 18IN (Suture) ×2
SUTURE MONOCRYL 3-0 PS-2 L18 IN (Suture) ×6
SUTURE MONOCRYL 3-0 PS-2 L18 IN MONOFILAMENT UNDYED ABSORBABLE (Suture) ×6 IMPLANT
SUTURE MONOCRYL 3-0 PS-2 L27 IN (Suture) ×6
SUTURE MONOCRYL 3-0 PS-2 L27 IN MONOFILAMENT UNDYED ABSORBABLE (Suture) ×6 IMPLANT
SUTURE MONOCRYL 3-0 PS2 27IN (Suture) ×2
SUTURE MONOCRYL 4-0 PS-2 L18 IN (Suture) ×12
SUTURE MONOCRYL 4-0 PS-2 L18 IN MONOFILAMENT UNDYED ABSORBABLE (Suture) ×12 IMPLANT
SUTURE MONOCRYL 4-0 PS2 18 IN (Suture) ×4
SUTURE PDS II 2-0 CT-1 L27 IN (Suture) ×12
SUTURE PDS II 2-0 CT-1 L27 IN MONOFILAMENT VIOLET ABSORBABLE (Suture) ×12 IMPLANT
SUTURE PDS II 2-0 CT1 27IN (Suture) ×4
SUTURE PLAIN FAST ABSORBING GUT 5-0 PC-1 (Suture) ×6
SUTURE PLAIN FAST ABSORBING GUT 5-0 PC-1 L18 IN MNFLMNT YELLOWISH TAN (Suture) ×6 IMPLANT
SUTURE PLN GUT 5.0 (Suture) ×2
SUTURE STRATAFIX SPIRAL MONOCRYL PLUS (Suture) ×12
SUTURE STRATAFIX SPIRAL MONOCRYL PLUS 3-0 PS-2 L27 IN KNOTLESS TISSUE (Suture) ×12 IMPLANT
SUTURE STRTFX 3-0 SP MON+ 70CM (Suture) ×4
SUTURE VICRYL 0-0 CT1 (Suture) ×6
SUTURE VICRYL 2-0 CT1 36IN (Suture) ×4
SYRINGE 1 ML GRADUATE LOK MDCL (Syringes, Needles) ×3 IMPLANT
SYRINGE 1 ML GRADUATE LOK MEDICAL (Syringes, Needles) ×3
SYRINGE 10 ML GRADUATE NONPYROGENIC DEHP (Syringes, Needles) ×30
SYRINGE 10 ML GRADUATE NONPYROGENIC DEHP FREE PVC FREE LOK MEDICAL (Syringes, Needles) ×30 IMPLANT
SYRINGE 1CC LL DISP (Syringes, Needles) ×1
SYRINGE 20 ML BD LUER-LOK MEDICAL (Syringes, Needles) ×6 IMPLANT
SYRINGE 30 ML CONCENTRIC TIP GRADUATE (Syringes, Needles) ×3
SYRINGE 30 ML CONCENTRIC TIP GRADUATE NONPYROGENIC DEHP FREE LOK (Syringes, Needles) ×3 IMPLANT
SYRINGE CATHETER TIP GRADUATED (Syringes, Needles) ×6
SYRINGE CATHETER TIP GRADUATED NONPYROGENIC DEHP FREE PVC FREE 50 ML (Syringes, Needles) ×6 IMPLANT
SYRINGE CATHTIP 60CC (Syringes, Needles) ×2
SYRINGE LUER LOCK 10CC (Syringes, Needles) ×10
SYRINGE LUER LOCK WO SFTY 30ML (Syringes, Needles) ×1
SYRINGE LUER-LOK STERILE 20CC (Syringes, Needles) ×2
SYRINGE MED 20ML LL LF STRL (Syringes, Needles) ×6
TOWEL L27 IN X W17 IN COTTON PREWASH (Other) ×12
TOWEL L27 IN X W17 IN COTTON PREWASH DELINT HIGH ABSORBENT BLUE (Other) ×12 IMPLANT
TOWEL OR DISP 10PK (Other) ×4
TRAY 1LYR 14FR 10ML 100%SIL (Tray) ×1
TRAY CATHETER 1 LAYER FOLEY DRAIN BAG PERI WIPE PVP SILICON ADULT OD14 (Tray) ×3 IMPLANT
TRAY CATHETER MEDLINE 1 LAYER FOLEY (Tray) ×3
TRAY CYSTO TRAY FOAKS (Pack) ×1
TRAY DRY SKIN SCRUB (Prep) ×2
TRAY MAJOR PLASTIC FOAKS (Pack) ×1
TRAY SKIN SCRUB L8 IN 6 WING 6 SPONGE STICK 2 TIP APPLICATOR DRY VINYL (Prep) ×6 IMPLANT
TRAY SKIN SCRUB MEDLINE L8 IN VINYL (Prep) ×6
TRAY SRG CYSTO IFOH (Pack) ×3
TRAY SRG MAJOR PLASTIC IFOH (Pack) ×3
TRAY SURGICAL CYSTO FOAKS (Pack) ×3 IMPLANT
TRAY SURGICAL MAJOR PLASTIC FOAKS (Pack) ×3 IMPLANT
TUBING HVP RE-INJECTION (Tubing) ×1
TUBING INFILTRATION 144IN REINJECTION HVP (Tubing) ×3 IMPLANT
TUBING INFILTRATION L13 FT UNIVERSAL Y (Tubing) ×3
TUBING INFILTRATION L13 FT UNIVERSAL Y CONTOUR GENESIS (Tubing) ×3 IMPLANT
TUBING INFILTRATION LIPOTOWER 1 SPIKE (Tubing) ×3 IMPLANT
TUBING INFILTRTN GENESIS 13'X7 (Tubing) ×1
TUBING INFLTR 144IN REINJECTION HVP (Tubing) ×3
TUBING INFLTR LIPOTOWER 1 SPK (Tubing) ×3
TUBING PAL ASPIRATION 12FT (Tubing) ×1
TUBING SGL SPIKE INFIL 15.5FT (Tubing) ×1
TUBING SUCTION L12 FT SET (Tubing) ×3
TUBING SUCTION L12 FT SET PAL (Tubing) ×3 IMPLANT
UNDRGLOV SZ 7 PI INDICATR BLUE (Glove) ×1
WRAP COFLEX 6INX5YD NLTX STRL (Procedure Accessories) ×1

## 2020-12-04 NOTE — Anesthesia Preprocedure Evaluation (Signed)
Anesthesia Evaluation    AIRWAY    Mallampati: II    TM distance: >3 FB  Neck ROM: full  Mouth Opening:full  Planned to use difficult airway equipment: No CARDIOVASCULAR    cardiovascular exam normal       DENTAL           PULMONARY    pulmonary exam normal     OTHER FINDINGS                  Relevant Problems   No relevant active problems   PMH: h/o iron def anemia, s/p gastric bypass  PSH: no personal or fam h/o anesthesia problems  NKDA              Anesthesia Plan    ASA 2     general                                 informed consent obtained    Plan discussed with CRNA.                   Signed by: Nemiah Commander, MD 12/04/20 7:23 AM

## 2020-12-04 NOTE — Anesthesia Postprocedure Evaluation (Signed)
Anesthesia Post Evaluation    Patient: Caitlin Morse    Procedure(s) with comments:  LIFT, THIGH (COSMETIC) - THIGH LIFT, FAT TRANSFER TO BUTTOCKS, LIPOSUCTION INNER THIGH, LIPOSUCTION ANTERIOR THIGH, LIPOSUCTION LATERAL THIGH, LIPOSUCTION LOWER BACK  ASST=Y  INJECTION, FAT, (COSMETIC)  LIPOSUCTION, THIGHS (COSMETIC)  LIPOSUCTION, BACK (COSMETIC)    Anesthesia type: general    Last Vitals:   Vitals Value Taken Time   BP 169/83 12/04/20 1550   Temp 35.9 C (96.7 F) 12/04/20 1538   Pulse 83 12/04/20 1550   Resp 15 12/04/20 1550   SpO2 100 % 12/04/20 1550                 Anesthesia Post Evaluation:     Patient Evaluated: PACU  Patient Participation: complete - patient participated  Level of Consciousness: responsive to verbal stimuli  Pain Score: 0  Pain Management: adequate    Airway Patency: patent    Anesthetic complications: No      PONV Status: none    Cardiovascular status: acceptable  Respiratory status: acceptable  Hydration status: acceptable  Comments: Pt just came out of the OR.  She is still very sleepy.  She is not complaining of pain, nausea or anything.  Her O2 saturation is good and she is tightly wound in a set of post op tight stretch pants for recovery.  Her breathing is good, no obstruction. No post surgical procedure anesthesia issues.         Signed by: Renaldo Fiddler, MD, 12/04/2020 3:57 PM

## 2020-12-04 NOTE — Transfer of Care (Signed)
Anesthesia Transfer of Care Note    Patient: Caitlin Morse    Procedures performed: Procedure(s) with comments:  LIFT, THIGH (COSMETIC) - THIGH LIFT, FAT TRANSFER TO BUTTOCKS, LIPOSUCTION INNER THIGH, LIPOSUCTION ANTERIOR THIGH, LIPOSUCTION LATERAL THIGH, LIPOSUCTION LOWER BACK  ASST=Y  INJECTION, FAT, (COSMETIC)  LIPOSUCTION, THIGHS (COSMETIC)  LIPOSUCTION, BACK (COSMETIC)    Anesthesia type: General ETT    Patient location:Phase I PACU    Last vitals:   Vitals:    12/04/20 1538   BP: (!) 179/100   Pulse: 80   Resp: (!) 11   Temp: (!) 35.9 C (96.7 F)   SpO2: 100%       Post pain: Patient not complaining of pain, continue current therapy      Mental Status:sedated    Respiratory Function: tolerating face mask    Cardiovascular: stable    Nausea/Vomiting: patient not complaining of nausea or vomiting    Hydration Status: adequate    Post assessment: no apparent anesthetic complications, no reportable events and no evidence of recall    Signed by: Ervin Knack, CRNA  12/04/20 3:43 PM

## 2020-12-04 NOTE — Brief Op Note (Signed)
BRIEF OP NOTE    Date Time: 12/04/20 3:43 PM    Patient Name:   Caitlin Morse    Date of Operation:   12/04/2020    Providers Performing:   Surgeon(s):  Ardeen Fillers, MD    Assistant (s):   Circulator: Tomi Bamberger, RN  Relief Circulator: Rubin Payor, RN  Relief Scrub: Machen, Hailey  Scrub Person: Emeline General  First Assistant: Phoebe Sharps    Operative Procedure:   Procedure(s):  LIFT, THIGH (COSMETIC)  INJECTION, FAT, (COSMETIC)  LIPOSUCTION, THIGHS (COSMETIC)  LIPOSUCTION, BACK (COSMETIC)    Preoperative Diagnosis:   Pre-Op Diagnosis Codes:     * Encounter for cosmetic surgery [Z41.1]    Postoperative Diagnosis:   Post-Op Diagnosis Codes:     * Encounter for cosmetic surgery [Z41.1]    Anesthesia:   General    Estimated Blood Loss:    * No values recorded between 12/04/2020  8:45 AM and 12/04/2020  3:43 PM *    Implants:   * No implants in log *    Drains:   Drains: Yes, 2 drains, Blake 15    Specimens:   * No specimens in log *      Findings:   None    Complications:   None    Signed by: Ardeen Fillers, MD                                                                           [place] [facility] OR

## 2020-12-04 NOTE — H&P (Signed)
Caitlin Morse is an 40 y.o. female.    Past Medical History:   Diagnosis Date    Anemia 01/12/2018;07/2020    follows w/hematology most recent iron infusion 08/20/2020.    Anxiety     Pt states very anxious preop.    COVID-19 vaccine dose not administered     Pt states no COVID vaccine to date 10/05/2020.    Depression     hx-Not on a medication right now.     Encounter for blood transfusion 04/2017    01/18/18 [facility] - transfusing 2 units PRBC. No known issues.    Family history of DVT     Maternal Grandfather    Pap smear for cervical cancer screening 02/2017    normal     Snoring        Allergies: No Known Allergies    Active Problems:    * No active hospital problems. *    Height 1.651 m (5\' 5" ), weight 93.9 kg (207 lb).    ROS   Denies respiratory symptoms     Physical Exam   CV RRR  Lungs no audible wheezing  Legs: soft, excess skin laxity and adipose tissue     Assessment/Plan:  Labs reviewed   Thigh lift, liposuction thighs and back and fat transfer to buttock   Lovenox preop and postop  Ancef preop  SCD's     Ardeen Fillers, MD  12/04/2020

## 2020-12-04 NOTE — Discharge Instructions (Signed)
Post Anesthesia Discharge Instructions    Although you may be awake and alert in the recovery room, small amounts of anesthetic remain in your system for about 24 hours.  You may feel tired and sleepy during this time.      You are advised to go directly home from the hospital.    Plan to stay at home and rest for the remainder of the day.    It is advisable to have someone with you at home for 24 hours after surgery.    Do not operate a motor vehicle, or any mechanical or electrical equipment for the next 24 hours.      Be careful when you are walking around, you may become dizzy.  The effects of anesthesia and/or medications are still present and drowsiness may occur    Do not consume alcohol, tranquilizers, sleeping medications, or any other non prescribed medication for the remainder of the day.    Diet:  begin with liquids, progress your diet as tolerated or as directed by your surgeon.  Nausea and vomiting may occur in the next 24 hours.      Liposuction  Liposuction is a type of cosmetic surgery that removes excess fat from specific body areas. This may help improve the way the body looks. There are several different types of liposuction. They include ultrasound-assisted, laser-assisted, suction-assisted, and traditional with tumescent fluid injections. Although the fat removed will not return, liposuction is not a substitute for eating right and exercising. Also, removing a large amount of belly fat doesn't change risk factors for such conditions as diabetes or heart disease. For lasting results, you will have to control your weight, even after liposuction. Discuss your treatment goals and what long-term lifestyle changes you need with your healthcare provider. They can tell you more about what to expect, as well as the method of liposuction that will be used for you.     Preparing for surgery  Prepare as you have been told. Also:   Tell your healthcare provider about all medicines you take. This includes  herbs and other supplements and any illegal or illicit drugs. It also includes any blood thinners such as warfarin, clopidogrel, certain anti-inflammatory medicines, and daily aspirin. You may need to stop taking some or all of them before surgery.  If you smoke, stop smoking before surgery.  Follow any directions you are given for not eating or drinking before surgery (If you have been told to take medicines, take them with a small sip of water.)  The day of surgery  Before the procedure, you will be asked to sign an informed consent form. This form has information on the liposuction procedure. It will also list the risks, benefits, and alternatives to liposuction.. You can ask questions before you sign the form. Make sure all of your questions are answered before you sign the form.   The procedure can take 1 to 5 hours, depending on how many areas are being treated and the technique used. You may go home the same day. Or you may stay 1 or more nights in a hospital or a outpatient surgical center.   Before the procedure begins  An IV line is put into a vein in your arm or hand. This line delivers fluids and medicines.  You will be given medicine to keep you pain free during surgery. This may be general anesthesia, which puts you into a state like deep sleep. A tube may be inserted into your throat to  help you breathe. In some cases, sedation is given instead. This medicine relaxes you and makes you sleep lightly. If you have sedation, local anesthetic will be used to numb the areas being worked on. The anesthesiologist will discuss your options with you.  The skin over the sites to be worked on is marked with a sterile pen.  During the procedure  Tumescent fluid is injected into the surgical areas. This makes it easier to remove fat. The fluid also contains medicine to numb the site and reduce pain and bleeding.  The healthcare provider will make one or more small cuts (incisions) in the skin over the marked  sites.  The provider will put a thin metal tube (cannula) through an incision into the fat layer under the skin. The cannula is attached to a small vacuum or syringe. As the cannula is moved back and forth, excess fat is suctioned out. The provider may also use ultrasound, laser, or mechanical tools. These help loosen or remove fat.  When the procedure is done, the cannula and other tools are removed.  Incisions are closed with stitches. These may dissolve on their own. Or they may need to be removed at a later date. In certain cases, incisions are left open to heal. The surgical sites may be bandaged.    After the surgery  You will be taken to a room to wake up from the anesthesia. You may feel sleepy and nauseated. If a breathing tube was used, your throat may be sore at first. You will be given medicine to help prevent infection and manage pain. You will also likely be given compression garments to wear. These reduce swelling and help form a smooth shape. When youre ready, you will be released to go home. Have an adult family member or friend drive you.   Recovering at home  For the next few weeks, expect to have pain, swelling, and bruising. Once at home, follow the instructions you have been given. Your healthcare provider will tell you when you can return to your normal routine. Be sure to:   Take all prescribed medicine as directed. Take pain medicine on time. Don't wait for pain to get bad before taking it.  Dont shower for 72 hours after surgery, or as directed by the surgeon. Don't swim, take a bath, use a hot tub, or do other activities that cause incisions to be covered with water until your healthcare provider says its OK.  Care for your incisions as instructed by your surgeon. This includes keeping your bandages and incisions dry when bathing or showering.  Wear compression garments as directed. Doing so is important for a good cosmetic result.  Walk at least a few times daily. But dont push  yourself too hard.  Don't lift anything heavy or do strenuous activity, as directed. Talk with your healthcare provider about light exercise such as walking that you can do to maintain your weight until youre fully healed.  Dont drive until you are no longer taking prescription pain medicine and your healthcare provider says its OK. When riding in a car, carefully position the seatbelt so that it doesnt compress the body parts that had liposuction.  When to call your healthcare provider  Call your healthcare provider right away If you have any of the following:   Chest pain or trouble breathing (call 911)  Fever of 100.4 F ( 38C ) or higher, or as directed by your healthcare provider  Symptoms of infection at an  incision site, such as increased redness or swelling, warmth, worsening pain, or foul-smelling drainage  Irregular heartbeat  Pain not relieved by pain medicine  Numbness and tingling that does not go away within a week after the surgery  No urination within 24 hours  Bleeding from the surgical site (some pink-tinged discharge is normal)  Pain, swelling, redness, or warmth in your leg, calf, or thigh  Follow-up  You will have follow-up appointments so your healthcare provider can check how well youre healing. If you have stitches that need to be removed, this will be done during a follow-up visit. You and your healthcare provider will also monitor the cosmetic results of your procedure. If you have trouble starting or maintaining the needed lifestyle changes, ask your provider for help.   Risks and possible complications  Risks and possible complications include:   Bleeding or infection  Blood clots  Fat clots  Excessive scarring  Poor wound healing  Changes in sensation, such as numbness or pain  Skin discoloration  Abnormal collection of fluid (seroma)  Death of fat cells deep in the skin (fat necrosis)  Injury to nearby nerves, blood vessels, muscles, and organs  Contour (body shape)  irregularities  Not happy with cosmetic results  Risks of anesthesia. The anesthesiologist will discuss these with you.  [employer] last reviewed this educational content on 03/06/2019     2000-2022 [employer], [place]. All rights reserved. This information is not intended as a substitute for professional medical care. Always follow your healthcare professional's instructions.

## 2020-12-07 ENCOUNTER — Encounter: Payer: Self-pay | Admitting: Plastic Surgery

## 2020-12-08 NOTE — Op Note (Signed)
OPERATIVE REPORT     Caitlin Morse  Female, 40 y.o., 11/20/1980    MRN: [id number]    Date of surgery: 12/04/2020     Surgeon:     Alyce Pagan, MD     Procedure(s):  Cosmetic   Bilateral thigh lift   Liposuction thighs and back   Fat grafting to Buttocks      PREOPERATIVE DIAGNOSES:    Elective procedure for unacceptable cosmetic appearance [Z41.1]       POSTOPERATIVE DIAGNOSIS:  Elective procedure for unacceptable cosmetic appearance [Z41.1]     LOCAL ANESTHESIA  Tumescent solution      LIPOASPIRATE     Fat grafting   on each buttock)         SPECIMENS:   None         DVT prophylaxis : LOvenox 40 mg prior to induction of anesthesia and SCDs      INDICATIONS FOR SURGERY:  17 -year-old female interested in improving her appearance. Patient is scheduled to undergo thigh lift, liposuction and Fat grafting to Buttocks.  Procedures, risks and complications were discussed including, but not limited to scars, asymmetry, skin loss, change in size and shape with weight changes, bleeding, hematoma, infection, contour irregularities, and need for secondary procedures.  The risks associated with surgery in general, including but not limited to bleeding, infection, reaction to anesthesia, deep venous thrombosis, pneumonia, pulmonary embolus, and death were also discussed before surgery. All discussion points understood, and questions answered satisfactorily.      DESCRIPTION OF PROCEDURE:    In the preoperative area, the patient was marked while in the upright position. The amount of skin to be removed was estimated, and the fat deposits were marked. The patient was then brought to the operating room, properly positioned on the operating room table in supine position, pressure points padded, sequential compression sleeves were placed on her lower legs and activated prior to anesthesia administration. The appropriate time out/pause was completed with all staff present verifying the patient's identity, surgical procedure, surgical  site, review of medications and allergies, and confirmation of required devices/equipment. She received IV antibiotics. She was administered general anesthesia with endotracheal intubation.  Face, breasts and legs were prepped and sterilely draped.     We then turned our attention to the thighs. Tumescent solution was injected (1100  mL right thigh, 1100 mL left thigh).  Power assisted liposuction of both thighs and knees was then performed. 1300 of lipoaspirate were removed from the left thigh and 1300 mL on the right thigh). After liposuction, we then turned our attention to the right thigh, the anterior incision was made first. The dissection was then carried to the posterior marks. Careful and superficial dissection was performed along the inguinal crease to preserve lymphatics. Skin to be resected was then confirmed. Careful hemostasis was obtained. Excision of the skin started from distally to proximally, approximating the skin with staples. A drain was placed. A segmental excision and closure technique were then performed.The SFS was secured to the Colles fascia with 2-0 PDS. The SFS was closed with interrupted 2-0 Vicryl, the deep dermis was then closed with a running absorbable barbed suture followed by 4-0 Monocryl subcuticular closure. Dermabond perineo and ACE wrap were then applied. A similar procedure was performed on the left side.      We then positioned the patient in a prone position. She was appropriately padded. Patients back was prepped and draped in a sterile fashion. We first turned our attention  to the back and infiltrated the tumescent solution. After waiting for the effect of the epinephrine, SAFE liposuction was performed in the upper back and lower back 450 cc of lipoaspirate were removed from each side (900 cc total) . At the completion of the procedure, there was a good contour of the back. We then turned our attention to the buttocks, fat was injected with the Lois Huxley HVP  Auto-Graft Infusion System. A total of 1100 was injected in subcutaneous tissue of the left buttock and 1200 was injected in subcutaneous tissue of the right buttock.     At the completion of the surgical procedure, a debriefing was performed confirming the procedure performed. The instrument and lap tape/raytex count were correct.   She tolerated the procedure well, she was awakened from anesthesia and transferred from the operating room in satisfactory condition. I was present and performed the entire surgical procedure.       The procedures were completed without complication and patient tolerated well. The patient was taken to the recovery room in good condition.     Ardeen Fillers, MD
